# Patient Record
Sex: Male | Born: 1960 | Race: Black or African American | Hispanic: No | Marital: Single | Smoking: Current every day smoker
Health system: Southern US, Community
[De-identification: ages and names within clinical notes are randomized; demographics above are authoritative.]

## PROBLEM LIST (undated history)

## (undated) DIAGNOSIS — F319 Bipolar disorder, unspecified: Secondary | ICD-10-CM

## (undated) DIAGNOSIS — D869 Sarcoidosis, unspecified: Secondary | ICD-10-CM

## (undated) DIAGNOSIS — B1920 Unspecified viral hepatitis C without hepatic coma: Secondary | ICD-10-CM

## (undated) DIAGNOSIS — W3400XA Accidental discharge from unspecified firearms or gun, initial encounter: Secondary | ICD-10-CM

## (undated) DIAGNOSIS — I619 Nontraumatic intracerebral hemorrhage, unspecified: Secondary | ICD-10-CM

---

## 2014-07-14 ENCOUNTER — Inpatient Hospital Stay
Admit: 2014-07-14 | Discharge: 2014-07-14 | Disposition: A | Discharge status: Home / Self Care | Payer: PRIVATE HEALTH INSURANCE | Attending: Emergency Medicine

## 2014-07-14 DIAGNOSIS — T401X1A Poisoning by heroin, accidental (unintentional), initial encounter: Secondary | ICD-10-CM

## 2014-07-14 NOTE — ED Provider Notes (Addendum)
HPI Comments: Jimmy Curry is a 53 y.o. male who presents to the ED via EMS with complaint of heroin overdose. Patient was found unresponsive in his backyard after he overdosed on IV Heroin. His O2 sats was 93% on RA when he was found. He initially had no response to intranasal narcan 2 mg, but had significant improvement with narcan 0.8 mg IV. Patient began vomiting afterwards with vomiting continued in the ED upon arrival. Pt admits that he used IV heroin in celebration of the new year. Hx of IVDA, but states that he has not used in "years." At this time, patient denies having any acute pain. Denies chest pain or SOB.     Patient is a 53 y.o. male presenting with Ingested Medication. The history is provided by the patient and the EMS personnel.   Drug Overdose  This is a recurrent problem. The current episode started less than 1 hour ago. The problem occurs constantly. The problem has been rapidly improving. Pertinent negatives include no chest pain, no abdominal pain, no headaches and no shortness of breath. Nothing aggravates the symptoms. Nothing relieves the symptoms. Treatments tried: narcan. The treatment provided significant relief.        Past Medical History:   Diagnosis Date   ??? Sarcoidosis (HCC)    ; Hep C     History reviewed. No pertinent past surgical history.      History reviewed. No pertinent family history.    History     Social History   ??? Marital Status: SINGLE     Spouse Name: N/A     Number of Children: N/A   ??? Years of Education: N/A     Occupational History   ??? Not on file.     Social History Main Topics   ??? Smoking status: Not on file   ??? Smokeless tobacco: Not on file   ??? Alcohol Use: Yes   ??? Drug Use: Yes     Special: Heroin   ??? Sexual Activity: Not on file     Other Topics Concern   ??? Not on file     Social History Narrative   ??? No narrative on file       ALLERGIES: Review of patient's allergies indicates no known allergies.      Review of Systems    Constitutional: Negative.  Negative for fever, chills and fatigue.   HENT: Negative.  Negative for congestion, rhinorrhea and sore throat.    Eyes: Negative for pain and visual disturbance.   Respiratory: Negative.  Negative for cough, chest tightness and shortness of breath.    Cardiovascular: Negative.  Negative for chest pain, palpitations and leg swelling.   Gastrointestinal: Positive for vomiting. Negative for nausea, abdominal pain, diarrhea and constipation.   Endocrine: Negative.  Negative for polydipsia and polyuria.   Genitourinary: Negative.  Negative for dysuria, decreased urine volume and difficulty urinating.   Musculoskeletal: Negative.  Negative for myalgias, back pain, arthralgias, neck pain and neck stiffness.   Skin: Negative.  Negative for color change and rash.   Neurological: Negative.  Negative for weakness, light-headedness, numbness and headaches.   All other systems reviewed and are negative.      Filed Vitals:    07/14/14 1050 07/14/14 1100   BP: 120/91 120/87   Pulse: 116 104   Temp: 96.5 ??F (35.8 ??C)    Resp: 18 20   Height: 5\' 8"  (1.727 m)    Weight: 77.111 kg (170 lb)  SpO2: 100% 96%            Physical Exam   Constitutional: He is oriented to person, place, and time. He appears well-developed and well-nourished. No distress.   HENT:   Head: Normocephalic and atraumatic.   Mouth/Throat: Oropharynx is clear and moist.   Eyes: Conjunctivae and EOM are normal. Pupils are equal, round, and reactive to light. No scleral icterus.   Neck: Normal range of motion. Neck supple. No JVD present. No tracheal deviation present. No thyromegaly present.   Cardiovascular: Normal rate, regular rhythm, normal heart sounds and intact distal pulses.  Exam reveals no gallop and no friction rub.    No murmur heard.  Pulmonary/Chest: Effort normal and breath sounds normal. No respiratory distress. He has no wheezes. He has no rales. He exhibits no tenderness.    Abdominal: Soft. Bowel sounds are normal. He exhibits no distension and no mass. There is no tenderness. There is no rebound and no guarding.   Musculoskeletal: Normal range of motion. He exhibits no edema or tenderness.   Palpable 2+ bilateral pedal pulses dorsalis pedis, no calf tenderness   Neurological: He is alert and oriented to person, place, and time. No cranial nerve deficit.   Skin: Skin is warm and dry. He is not diaphoretic. No pallor.   Psychiatric: He has a normal mood and affect. His behavior is normal. Judgment and thought content normal.   Nursing note and vitals reviewed.       MDM  Number of Diagnoses or Management Options  Heroin overdose, accidental or unintentional, initial encounter:   Diagnosis management comments: 53 y.o. male with hx of IVDA (pt states has not used in years), brought in by EMS for IV Heroin Overdose. Initially no response to intranasal narcan 2 mg, but with significant response to narcan 0.8 mg IV. Patient alert and oriented x 3, and with stable vital signs in the ED. No signs of head injury. No focal neurologic deficits.       Plan:     SBIRT   Observe for 1 hour  -----  10:54 AM  Documented by Alwyn PeaJi Sun Yi, acting as a scribe for Dr. Rudy JewVictoria Romaniuk MD.     PROVIDER ATTESTATION:  12:29 PM    The entirety of this note, signed by me, accurately reflects all works, treatments, procedures, and medical decision making performed by me, Edythe LynnVictoria M Romaniuk, MD.              Patient Progress  Patient progress: stable      ED Course:     11:10 AM  Pt hyperventilating.   Upon reeval, patient with clear lung sounds.     11:26 AM  Pt no longer hyperventilating.     12:29 PM  Pt clinically stable, no complaints. Will discharge to home. Pt counseled to discontinue drug use.     Procedures

## 2014-07-14 NOTE — ED Notes (Signed)
Bedside and Verbal shift change report given to Ron, RN (oncoming nurse) by Caroline, RN (offgoing nurse). Report included the following information SBAR, ED Summary and MAR.

## 2014-07-14 NOTE — ED Notes (Signed)
This is a back timed note: Report given to RN Vernona RiegerLaura as the Primary RN will be off the unit. Pt condition remains stable. He continues to be fully A/O x 3, VS remain WNL. Oxygen turned off to check for hypoxia. No change noted in oxygen level. Pending dispo by the Attending.

## 2014-07-14 NOTE — ED Notes (Signed)
Pt brought in via EMS for heroin overdose. Pt found unresponsive, awoke after 0.8mg  narcan IV injection. Pt awake and alert upon arrival. Vomiting-given 4mg  Zofran PTA.

## 2014-07-14 NOTE — ED Notes (Signed)
Pt condition remains stable and unchanged. VS continue to be WNL. All needs met, no requests made. Pending arrival of family to take him home. Pt condition is improved.

## 2014-07-14 NOTE — ED Notes (Signed)
[facility], assumed care of pt at this time. Pt is sitting on the stretcher, tachapneac no pain reported. Pt encouraged to breath deep and slow, sats =99% on NC 2-Liters. Pt is stable, no acute changes noted from shift report. Call bell is in reach, bed board updated. Pt is stable. Given a large ice water per request. Dr. Eldridge Daceomaniuk is at the bedside.

## 2014-07-14 NOTE — ED Notes (Signed)
Discharge instructions given with 0 prescriptions. Pt to follow up with his Primary MD, or return if symptoms worsen. All questions were answered at this time, pt verbalized understanding. Pt condition is stable.

## 2014-07-14 NOTE — ED Notes (Signed)
Pt resting awake, alert, reports nausea relieved by zofran from EMS. On monitor resting no distress.

## 2016-12-16 ENCOUNTER — Emergency Department: Payer: Self-pay

## 2016-12-16 ENCOUNTER — Emergency Department
Admission: EM | Admit: 2016-12-16 | Discharge: 2016-12-16 | Discharge status: Against Medical Advice | Payer: Self-pay | Attending: Emergency Medicine | Admitting: Emergency Medicine

## 2016-12-16 ENCOUNTER — Encounter: Payer: Self-pay | Admitting: *Deleted

## 2016-12-16 DIAGNOSIS — Y929 Unspecified place or not applicable: Secondary | ICD-10-CM | POA: Insufficient documentation

## 2016-12-16 DIAGNOSIS — Y999 Unspecified external cause status: Secondary | ICD-10-CM | POA: Insufficient documentation

## 2016-12-16 DIAGNOSIS — L03113 Cellulitis of right upper limb: Secondary | ICD-10-CM

## 2016-12-16 DIAGNOSIS — W57XXXA Bitten or stung by nonvenomous insect and other nonvenomous arthropods, initial encounter: Secondary | ICD-10-CM | POA: Insufficient documentation

## 2016-12-16 DIAGNOSIS — L03114 Cellulitis of left upper limb: Secondary | ICD-10-CM | POA: Insufficient documentation

## 2016-12-16 DIAGNOSIS — F17200 Nicotine dependence, unspecified, uncomplicated: Secondary | ICD-10-CM | POA: Insufficient documentation

## 2016-12-16 DIAGNOSIS — Y939 Activity, unspecified: Secondary | ICD-10-CM | POA: Insufficient documentation

## 2016-12-16 HISTORY — DX: Sarcoidosis, unspecified: D86.9

## 2016-12-16 HISTORY — DX: Unspecified viral hepatitis C without hepatic coma: B19.20

## 2016-12-16 MED ORDER — SODIUM CHLORIDE 0.9 % IV BOLUS (SEPSIS): 1000.0000 mL | Freq: Once | INTRAVENOUS | Status: DC

## 2016-12-16 NOTE — ED Notes (Addendum)
See triage note  States he noticed a possible tick to left hand at thumb web space  States he tried to pull off an insect/tick from that area   Pt has open wound to area  With swelling and redness

## 2016-12-16 NOTE — ED Triage Notes (Signed)
Pt arrives with a "bite" to his left hand at the base of his thumb, states he noticed it Saturday, states he poured bleach on his hand to try and kill what he believes is a worm in his hand, states when you shine a light on it it begins to move and it hurts, red and white area noticed with a dark middle, states "it comes up to the surface because it is cold in there", states general hand pain, states he used tweezers to try and pick it out

## 2016-12-16 NOTE — ED Notes (Signed)
Patient refusing IV placement and wound culture at this time. PA at bedside speaking to patient. Patient states, "I'm fine I know what to do". Risks of leaving explained to patient by PA. Patient verbalizes understanding but still wishes to leave. Electronic AMA form signed and witnessed by this RN. Patient refusing discharge vital signs be taken. Even and non labored respirations noted. Patient ambulatory.

## 2016-12-16 NOTE — Discharge Instructions (Signed)
Patient has elected to leave AMA. Highly encourage patient to remain for medical treatment and explained risk of leaving AMA. Patient verbalized understanding of risk and wish to leave AMA.

## 2016-12-16 NOTE — ED Provider Notes (Signed)
[facility] Emergency Department Provider Note   ____________________________________________   I have reviewed the triage vital signs and the nursing notes.   HISTORY  Chief Complaint Insect Bite    HPI Alexis Castaneda is a 56 y.o. male    Patient denies fever, chills, headache, vision changes, chest pain, chest tightness, shortness of breath, abdominal pain, nausea and vomiting.  Past Medical History:  Diagnosis Date  . Hepatitis C   . Sarcoidosis     There are no active problems to display for this patient.   History reviewed. No pertinent surgical history.  Prior to Admission medications   Not on File    Allergies Patient has no known allergies.  History reviewed. No pertinent family history.  Social History Social History  Substance Use Topics  . Smoking status: Current Every Day Smoker  . Smokeless tobacco: Never Used  . Alcohol use Yes    Review of Systems Constitutional: Negative for fever/chills Eyes: No visual changes. ENT:  Negative for sore throat and for difficulty swallowing Cardiovascular: Denies chest pain. Respiratory: Denies cough Denies shortness of breath. Gastrointestinal: No abdominal pain.  No nausea, vomiting, diarrhea. Musculoskeletal: Negative for back pain. Negative for generalized body aches. Skin: Negative for rash. Positive for open wound the left webspace of the thumb. Neurological: Negative for headaches.  Negative focal weakness or numbness. Negative for loss of consciousness. Able to ambulate. ____________________________________________   PHYSICAL EXAM:  VITAL SIGNS: ED Triage Vitals [12/16/16 1121]  Enc Vitals Group     BP 108/75     Pulse Rate (!) 106     Resp 16     Temp 99.2 F (37.3 C)     Temp Source Oral     SpO2 98 %     Weight 150 lb (68 kg)     Height 5\' 8"  (1.727 m)     Head Circumference      Peak Flow      Pain Score      Pain Loc      Pain Edu?      Excl. in GC?      Constitutional: Alert and oriented. Well appearing and in no acute distress.  Head: Normocephalic and atraumatic. Eyes: Conjunctivae are normal.  Mouth/Throat: Mucous membranes are moist.  Neck: Supple. Cardiovascular: Normal rate, regular rhythm. Normal distal pulses. Respiratory: Normal respiratory effort.  Gastrointestinal: Soft and nontender. No distention. Musculoskeletal: Nontender with normal range of motion in all extremities. Neurologic: Normal speech and language. No gross focal neurologic deficits are appreciate. No sensory loss or abnormal reflexes.  Skin:  Skin is warm, dry and intact. No rash noted. Open wound left hand approximately thumb web space with purulent drainage, edematous and induration.  Psychiatric: Mood and affect are normal.  ____________________________________________   LABS (all labs ordered are listed, but only abnormal results are displayed)  Labs Reviewed - No data to display ____________________________________________  EKG None  ____________________________________________  RADIOLOGY DG left hand IMPRESSION: There is subtle soft tissue lucency in the thenar are eminence which may reflect gas as might be seen with cellulitis. No radiopaque foreign body is observed. ____________________________________________   PROCEDURES  Procedure(s) performed: no    Critical Care performed: no ____________________________________________   INITIAL IMPRESSION / ASSESSMENT AND PLAN / ED COURSE  Pertinent labs & imaging results that were available during my care of the patient were reviewed by me and considered in my medical decision making (see chart for details).  Patient presents  with open wound along left hand thumb web space. Physical exam findings and imaging are reassuring wound is likely cellulitis. Spoke with the patient and recommended drawing labs, culturing the wound base in order to appropriately treat the likely infection. Patient  refused further treatment and requested to sign out AMA. With the RN in the room, explained the risks of leaving without treatment and the patient verbalized understanding of the risk and requested to be discharged AMA. Patient signed out AMA.     _______________   FINAL CLINICAL IMPRESSION(S) / ED DIAGNOSES  Final diagnoses:  Bug bite with infection, initial encounter  Cellulitis of right hand       NEW MEDICATIONS STARTED DURING THIS VISIT:  There are no discharge medications for this patient.    Note:  This document was prepared using Dragon voice recognition software and may include unintentional dictation errors.    Clois Comber, PA-C 12/16/16 1508    Minna Antis, MD 12/17/16 2244

## 2017-09-18 ENCOUNTER — Emergency Department: Payer: Self-pay

## 2017-09-18 ENCOUNTER — Other Ambulatory Visit: Payer: Self-pay

## 2017-09-18 ENCOUNTER — Encounter: Payer: Self-pay | Admitting: Emergency Medicine

## 2017-09-18 ENCOUNTER — Emergency Department
Admission: EM | Admit: 2017-09-18 | Discharge: 2017-09-18 | Disposition: A | Discharge status: Home / Self Care | Payer: Self-pay | Attending: Emergency Medicine | Admitting: Emergency Medicine

## 2017-09-18 DIAGNOSIS — R4789 Other speech disturbances: Secondary | ICD-10-CM | POA: Insufficient documentation

## 2017-09-18 DIAGNOSIS — F17200 Nicotine dependence, unspecified, uncomplicated: Secondary | ICD-10-CM | POA: Insufficient documentation

## 2017-09-18 DIAGNOSIS — H532 Diplopia: Secondary | ICD-10-CM | POA: Insufficient documentation

## 2017-09-18 DIAGNOSIS — F309 Manic episode, unspecified: Secondary | ICD-10-CM | POA: Insufficient documentation

## 2017-09-18 DIAGNOSIS — F319 Bipolar disorder, unspecified: Secondary | ICD-10-CM | POA: Insufficient documentation

## 2017-09-18 DIAGNOSIS — F308 Other manic episodes: Secondary | ICD-10-CM

## 2017-09-18 DIAGNOSIS — F1410 Cocaine abuse, uncomplicated: Secondary | ICD-10-CM

## 2017-09-18 HISTORY — DX: Nontraumatic intracerebral hemorrhage, unspecified: I61.9

## 2017-09-18 HISTORY — DX: Bipolar disorder, unspecified: F31.9

## 2017-09-18 LAB — URINALYSIS, COMPLETE (UACMP) WITH MICROSCOPIC
Bilirubin Urine: NEGATIVE
Glucose, UA: NEGATIVE mg/dL
Hgb urine dipstick: NEGATIVE
Ketones, ur: 5 mg/dL — AB
Leukocytes, UA: NEGATIVE
Nitrite: NEGATIVE
PROTEIN: 30 mg/dL — AB
Specific Gravity, Urine: 1.028 (ref 1.005–1.030)
pH: 5 (ref 5.0–8.0)

## 2017-09-18 LAB — CBC WITH DIFFERENTIAL/PLATELET
BASOS ABS: 0 10*3/uL (ref 0–0.1)
BASOS PCT: 1 %
EOS PCT: 2 %
Eosinophils Absolute: 0.1 10*3/uL (ref 0–0.7)
HCT: 47.6 % (ref 40.0–52.0)
Hemoglobin: 15.8 g/dL (ref 13.0–18.0)
LYMPHS PCT: 18 %
Lymphs Abs: 0.9 10*3/uL — ABNORMAL LOW (ref 1.0–3.6)
MCH: 28 pg (ref 26.0–34.0)
MCHC: 33.1 g/dL (ref 32.0–36.0)
MCV: 84.7 fL (ref 80.0–100.0)
Monocytes Absolute: 0.7 10*3/uL (ref 0.2–1.0)
Monocytes Relative: 13 %
NEUTROS ABS: 3.4 10*3/uL (ref 1.4–6.5)
Neutrophils Relative %: 66 %
Platelets: 315 10*3/uL (ref 150–440)
RBC: 5.62 MIL/uL (ref 4.40–5.90)
RDW: 13.8 % (ref 11.5–14.5)
WBC: 5.1 10*3/uL (ref 3.8–10.6)

## 2017-09-18 LAB — URINE DRUG SCREEN, QUALITATIVE (ARMC ONLY)
Amphetamines, Ur Screen: NOT DETECTED
BARBITURATES, UR SCREEN: NOT DETECTED
Benzodiazepine, Ur Scrn: NOT DETECTED
Cannabinoid 50 Ng, Ur ~~LOC~~: NOT DETECTED
Cocaine Metabolite,Ur ~~LOC~~: POSITIVE — AB
MDMA (Ecstasy)Ur Screen: NOT DETECTED
METHADONE SCREEN, URINE: NOT DETECTED
Opiate, Ur Screen: NOT DETECTED
Phencyclidine (PCP) Ur S: NOT DETECTED
TRICYCLIC, UR SCREEN: NOT DETECTED

## 2017-09-18 LAB — COMPREHENSIVE METABOLIC PANEL
ALBUMIN: 4.7 g/dL (ref 3.5–5.0)
ALT: 32 U/L (ref 17–63)
AST: 41 U/L (ref 15–41)
Alkaline Phosphatase: 87 U/L (ref 38–126)
Anion gap: 12 (ref 5–15)
BUN: 9 mg/dL (ref 6–20)
CO2: 21 mmol/L — AB (ref 22–32)
CREATININE: 1.12 mg/dL (ref 0.61–1.24)
Calcium: 9.3 mg/dL (ref 8.9–10.3)
Chloride: 104 mmol/L (ref 101–111)
GFR calc Af Amer: >60 mL/min (ref >60)
GLUCOSE: 109 mg/dL — AB (ref 65–99)
Potassium: 3.7 mmol/L (ref 3.5–5.1)
Sodium: 137 mmol/L (ref 135–145)
Total Bilirubin: 1 mg/dL (ref 0.3–1.2)
Total Protein: 8.3 g/dL — ABNORMAL HIGH (ref 6.5–8.1)

## 2017-09-18 LAB — ETHANOL: Alcohol, Ethyl (B): <10 mg/dL (ref <10)

## 2017-09-18 LAB — TROPONIN I

## 2017-09-18 MED ORDER — QUETIAPINE FUMARATE 300 MG PO TABS: 300.0000 mg | ORAL_TABLET | Freq: Every day | ORAL | 1 refills | Status: DC

## 2017-09-18 MED ORDER — DIAZEPAM 5 MG PO TABS
10.0000 mg | ORAL_TABLET | Freq: Once | ORAL | Status: AC
  Administered 2017-09-18: 10 mg via ORAL
  Filled 2017-09-18: qty 2

## 2017-09-18 NOTE — ED Provider Notes (Signed)
[facility] Emergency Department Provider Note       Time seen: ----------------------------------------- 12:48 PM on 09/18/2017 -----------------------------------------   I have reviewed the triage vital signs and the nursing notes.  HISTORY   Chief Complaint No chief complaint on file.    HPI Alexis Castaneda is a 57 y.o. male with a history of otitis C and sarcoidosis who presents to the ED for altered mental status.  Patient arrives via EMS for altered mental status.  Patient presents with pressured speech and no further information is available at this time.  He denies any complaints other than some double vision that he had had earlier.  Patient denies alcohol or drug abuse.  Past Medical History:  Diagnosis Date  . Hepatitis C   . Sarcoidosis     There are no active problems to display for this patient.   No past surgical history on file.  Allergies Patient has no known allergies.  Social History Social History   Tobacco Use  . Smoking status: Current Every Day Smoker  . Smokeless tobacco: Never Used  Substance Use Topics  . Alcohol use: Yes  . Drug use: Not on file   Review of Systems Constitutional: Negative for fever. Eyes: Negative for vision changes ENT:  Negative for congestion, sore throat Cardiovascular: Negative for chest pain. Respiratory: Negative for shortness of breath. Gastrointestinal: Negative for abdominal pain, vomiting and diarrhea. Genitourinary: Negative for dysuria. Musculoskeletal: Negative for back pain. Skin: Negative for rash. Neurological: Negative for headaches, focal weakness or numbness.  All systems negative/normal/unremarkable except as stated in the HPI  ____________________________________________   PHYSICAL EXAM:  VITAL SIGNS: ED Triage Vitals  Enc Vitals Group     BP      Pulse      Resp      Temp      Temp src      SpO2      Weight      Height      Head Circumference      Peak  Flow      Pain Score      Pain Loc      Pain Edu?      Excl. in GC?    Constitutional: Alert and oriented. Well appearing and in no distress. Eyes: Conjunctivae are normal. Normal extraocular movements. ENT   Head: Normocephalic and atraumatic.   Nose: No congestion/rhinnorhea.   Mouth/Throat: Mucous membranes are moist.   Neck: No stridor. Cardiovascular: Normal rate, regular rhythm. No murmurs, rubs, or gallops. Respiratory: Normal respiratory effort without tachypnea nor retractions. Breath sounds are clear and equal bilaterally. No wheezes/rales/rhonchi. Gastrointestinal: Soft and nontender. Normal bowel sounds Musculoskeletal: Nontender with normal range of motion in extremities. No lower extremity tenderness nor edema. Neurologic:  Normal speech and language. No gross focal neurologic deficits are appreciated.  Skin:  Skin is warm, dry and intact. No rash noted. Psychiatric: Elevated mood, pressured speech ____________________________________________  ED COURSE:  As part of my medical decision making, I reviewed the following data within the electronic MEDICAL RECORD NUMBER History obtained from family if available, nursing notes, old chart and ekg, as well as notes from prior ED visits. Patient presented for altered mental status and possible manic behavior, we will assess with labs as indicated at this time.   Procedures ____________________________________________   LABS (pertinent positives/negatives)  Labs Reviewed  CBC WITH DIFFERENTIAL/PLATELET - Abnormal; Notable for the following components:      Result Value  Lymphs Abs 0.9 (*)    All other components within normal limits  COMPREHENSIVE METABOLIC PANEL - Abnormal; Notable for the following components:   CO2 21 (*)    Glucose, Bld 109 (*)    Total Protein 8.3 (*)    All other components within normal limits  URINALYSIS, COMPLETE (UACMP) WITH MICROSCOPIC - Abnormal; Notable for the following components:    Color, Urine AMBER (*)    APPearance HAZY (*)    Ketones, ur 5 (*)    Protein, ur 30 (*)    Bacteria, UA RARE (*)    Squamous Epithelial / LPF 0-5 (*)    All other components within normal limits  URINE DRUG SCREEN, QUALITATIVE ([facility] ONLY) - Abnormal; Notable for the following components:   Cocaine Metabolite,Ur  POSITIVE (*)    All other components within normal limits  TROPONIN I  ETHANOL  CBG MONITORING, ED    RADIOLOGY  CT head IMPRESSION: Negative CT of the head. ____________________________________________  DIFFERENTIAL DIAGNOSIS   Acute psychosis, acute mania, substance abuse, overdose, occult infection, dehydration  FINAL ASSESSMENT AND PLAN  Acute mania, cocaine abuse   Plan: The patient had presented for altered mental status with elevated mood and pressured speech. Patient's labs are unremarkable except for cocaine and his tox screen. Patient's imaging is normal.  He is stable for psychiatric evaluation and disposition.   Ulice DashJohnathan E Klever [place], MD   Note: This note was generated in part or whole with voice recognition software. Voice recognition is usually quite accurate but there are transcription errors that can and very often do occur. I apologize for any typographical errors that were not detected and corrected.     Emily FilbertWilliams, Kayd Launer E, MD 09/18/17 [phone]

## 2017-09-18 NOTE — ED Notes (Signed)
Calvin, TTS at bedside at this time.  

## 2017-09-18 NOTE — ED Notes (Signed)
Dr. Clapacs at bedside at this time.  

## 2017-09-18 NOTE — Consult Note (Signed)
[place] Psychiatry Consult   Reason for Consult: Consult for 57 year old man who came to the emergency room initially seeking help for pain in his side later noticed to be hypomanic Referring Physician: Jimmye Norman Patient Identification: Alexis Castaneda MRN:  [id number] Principal Diagnosis: Hypomania [facility]) Diagnosis:   Patient Active Problem List   Diagnosis Date Noted  . 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 Bipolar 1 disorder ([place]) [F31.9] 09/18/2017  . Cocaine abuse ([place]) [F14.10] 09/18/2017    Total Time spent with patient: 1 hour  Subjective:   Alexis Castaneda is a 57 y.o. male patient admitted with "maybe I do need some help".  HPI: Patient interviewed chart reviewed.  This is a 57 year old man who came to the emergency room today initially complaining of pain in his flank and blurry vision last night while driving.  Emergency room doctor noticed that he had abnormal mental status and required him to stay to see the psychiatrist.  Patient tells a rambling story about how he was in [place] last night and used a lot of cocaine with a friend.  He then decided to drive all the way back to [place].  On the way back he kept having problems with his depth perception.  When he came home this morning his mother told him to come to the hospital.  Patient says that other than last night he has been sleeping okay.  Feels like his mood is really good.  Admits however that his mood gets labile at times and he gets hyperactive.  Patient claims that yesterday was an unusual situation to use cocaine and that he had not had any for months before that.  Denies that he had been drinking or using any other drugs.  Patient denies any suicidal or homicidal thoughts.  Although he was a little belligerent when he found out he was committed he did not express any violence.  Patient states in agreement to getting back on psychiatric medicine.  Social history: A lot of his stories are rambling and a little  confusing but apparently he claims he lives in [place] full-time taking care of his mother and he had just been back up to [facility] for a few days to settle some of his legal affairs.  Medical history: History of sarcoidosis and hepatitis C not seeing a doctor or on any medicine currently.  Substance abuse history: Admits that he has had problems with cocaine in the past but says he had been off it for weeks or months recently until last night.  Denies that he uses other drugs.  Past Psychiatric History: Patient admits he has had psychiatric treatment in the past mostly in the [place] area.  Unclear still to me whether he has been admitted to the hospital.  Admitted to one previous suicide attempt in 2006 but none since then.  Patient says that he has a diagnosis of bipolar disorder and was most recently on a combination of Depakote Seroquel and Wellbutrin but has not taken it in about 2 years.  Risk to Self: Suicidal Ideation: No Suicidal Intent: No Is patient at risk for suicide?: No Suicidal Plan?: No Access to Means: No What has been your use of drugs/alcohol within the last 12 months?: Cocaine & Alcohol How many times?: 0 Other Self Harm Risks: n/a Triggers for Past Attempts: None known Intentional Self Injurious Behavior: None Risk to Others: Homicidal Ideation: No Thoughts of Harm to Others: No Current Homicidal Intent: No Current Homicidal Plan: No Access to Homicidal Means: No  Identified Victim: Reports of none History of harm to others?: No Assessment of Violence: None Noted Violent Behavior Description: Reports of none Does patient have access to weapons?: No Criminal Charges Pending?: No Does patient have a court date: No Prior Inpatient Therapy: Prior Inpatient Therapy: Yes Prior Therapy Dates: Unable to share dates Prior Therapy Facilty/Provider(s): [facility] Reason for Treatment: "Dual Diagnosis"--Bipolar & Substance Use Prior Outpatient  Therapy: Prior Outpatient Therapy: Yes Prior Therapy Dates: 2017 Prior Therapy Facilty/Provider(s): [facility] Reason for Treatment: "Dual Diagnosis"--Bipolar & Substance Use Does patient have an ACCT team?: No Does patient have Intensive In-House Services?  : No Does patient have [facility] services? : No Does patient have P4CC services?: No  Past Medical History:  Past Medical History:  Diagnosis Date  . Bipolar 1 disorder ([place])   . Brain bleed ([place])   . Hepatitis C   . Sarcoidosis    History reviewed. No pertinent surgical history. Family History: History reviewed. No pertinent family history. Family Psychiatric  History: Denies any Social History:  Social History   Substance and Sexual Activity  Alcohol Use Yes  . Alcohol/week: 1.8 oz  . Types: 3 Cans of beer per week   Comment: daily     Social History   Substance and Sexual Activity  Drug Use Yes  . Types: Cocaine   Comment: past 2016    Social History   Socioeconomic History  . Marital status: Single    Spouse name: None  . Number of children: None  . Years of education: None  . Highest education level: None  Social Needs  . Financial resource strain: None  . Food insecurity - worry: None  . Food insecurity - inability: None  . Transportation needs - medical: None  . Transportation needs - non-medical: None  Occupational History  . None  Tobacco Use  . Smoking status: Current Every Day Smoker    Packs/day: 0.50  . Smokeless tobacco: Never Used  Substance and Sexual Activity  . Alcohol use: Yes    Alcohol/week: 1.8 oz    Types: 3 Cans of beer per week    Comment: daily  . Drug use: Yes    Types: Cocaine    Comment: past 2016  . Sexual activity: None  Other Topics Concern  . None  Social History Narrative  . None   Additional Social History:    Allergies:  No Known Allergies  Labs:  Results for orders placed or performed during the hospital encounter of 09/18/17 (from the past 48  hour(s))  CBC with Differential     Status: Abnormal   Collection Time: 09/18/17  1:11 PM  Result Value Ref Range   WBC 5.1 3.8 - 10.6 K/uL   RBC 5.62 4.40 - 5.90 MIL/uL   Hemoglobin 15.8 13.0 - 18.0 g/dL   HCT 47.6 40.0 - 52.0 %   MCV 84.7 80.0 - 100.0 fL   MCH 28.0 26.0 - 34.0 pg   MCHC 33.1 32.0 - 36.0 g/dL   RDW 13.8 11.5 - 14.5 %   Platelets 315 150 - 440 K/uL   Neutrophils Relative % 66 %   Neutro Abs 3.4 1.4 - 6.5 K/uL   Lymphocytes Relative 18 %   Lymphs Abs 0.9 (L) 1.0 - 3.6 K/uL   Monocytes Relative 13 %   Monocytes Absolute 0.7 0.2 - 1.0 K/uL   Eosinophils Relative 2 %   Eosinophils Absolute 0.1 0 - 0.7 K/uL   Basophils Relative  1 %   Basophils Absolute 0.0 0 - 0.1 K/uL    Comment: Performed at [facility], [address] [place], [place] [address]  Comprehensive metabolic panel     Status: Abnormal   Collection Time: 09/18/17  1:11 PM  Result Value Ref Range   Sodium 137 135 - 145 mmol/L   Potassium 3.7 3.5 - 5.1 mmol/L   Chloride 104 101 - 111 mmol/L   CO2 21 (L) 22 - 32 mmol/L   Glucose, Bld 109 (H) 65 - 99 mg/dL   BUN 9 6 - 20 mg/dL   Creatinine, Ser 1.12 0.61 - 1.24 mg/dL   Calcium 9.3 8.9 - 10.3 mg/dL   Total Protein 8.3 (H) 6.5 - 8.1 g/dL   Albumin 4.7 3.5 - 5.0 g/dL   AST 41 15 - 41 U/L   ALT 32 17 - 63 U/L   Alkaline Phosphatase 87 38 - 126 U/L   Total Bilirubin 1.0 0.3 - 1.2 mg/dL   GFR calc non Af Amer >60 >60 mL/min   GFR calc Af Amer >60 >60 mL/min    Comment: (NOTE) The eGFR has been calculated using the CKD EPI equation. This calculation has not been validated in all clinical situations. eGFR's persistently <60 mL/min signify possible Chronic Kidney Disease.    Anion gap 12 5 - 15    Comment: Performed at [facility], [address] [place], [place] [address]  Urinalysis, Complete w Microscopic     Status: Abnormal   Collection Time: 09/18/17  1:11 PM  Result Value Ref Range   Color, Urine AMBER (A) YELLOW     Comment: BIOCHEMICALS MAY BE AFFECTED BY COLOR   APPearance HAZY (A) CLEAR   Specific Gravity, Urine 1.028 1.005 - 1.030   pH 5.0 5.0 - 8.0   Glucose, UA NEGATIVE NEGATIVE mg/dL   Hgb urine dipstick NEGATIVE NEGATIVE   Bilirubin Urine NEGATIVE NEGATIVE   Ketones, ur 5 (A) NEGATIVE mg/dL   Protein, ur 30 (A) NEGATIVE mg/dL   Nitrite NEGATIVE NEGATIVE   Leukocytes, UA NEGATIVE NEGATIVE   RBC / HPF 0-5 0 - 5 RBC/hpf   WBC, UA 0-5 0 - 5 WBC/hpf   Bacteria, UA RARE (A) NONE SEEN   Squamous Epithelial / LPF 0-5 (A) NONE SEEN   Mucus PRESENT     Comment: Performed at [facility], [address] [place], [place] [address]  Troponin I     Status: None   Collection Time: 09/18/17  1:11 PM  Result Value Ref Range   Troponin I <0.03 <0.03 ng/mL    Comment: Performed at [facility], [address] [place], [place] [address]  Ethanol     Status: None   Collection Time: 09/18/17  1:11 PM  Result Value Ref Range   Alcohol, Ethyl (B) <10 <10 mg/dL    Comment:        LOWEST DETECTABLE LIMIT FOR SERUM ALCOHOL IS 10 mg/dL FOR MEDICAL PURPOSES ONLY Performed at [facility], [address] [place], [place] [address]   Urine Drug Screen, Qualitative ([facility] only)     Status: Abnormal   Collection Time: 09/18/17  1:11 PM  Result Value Ref Range   Tricyclic, Ur Screen NONE DETECTED NONE DETECTED   Amphetamines, Ur Screen NONE DETECTED NONE DETECTED   MDMA (Ecstasy)Ur Screen NONE DETECTED NONE DETECTED   Cocaine Metabolite,Ur [place] POSITIVE (A) NONE DETECTED   Opiate, Ur Screen NONE DETECTED NONE DETECTED  Phencyclidine (PCP) Ur S NONE DETECTED NONE DETECTED   Cannabinoid 50 Ng, Ur [place] NONE DETECTED NONE DETECTED   Barbiturates, Ur Screen NONE DETECTED NONE DETECTED   Benzodiazepine, Ur Scrn NONE DETECTED NONE DETECTED   Methadone Scn, Ur NONE DETECTED NONE DETECTED    Comment: (NOTE) Tricyclics + metabolites, urine    Cutoff 1000 ng/mL Amphetamines + metabolites,  urine  Cutoff 1000 ng/mL MDMA (Ecstasy), urine              Cutoff 500 ng/mL Cocaine Metabolite, urine          Cutoff 300 ng/mL Opiate + metabolites, urine        Cutoff 300 ng/mL Phencyclidine (PCP), urine         Cutoff 25 ng/mL Cannabinoid, urine                 Cutoff 50 ng/mL Barbiturates + metabolites, urine  Cutoff 200 ng/mL Benzodiazepine, urine              Cutoff 200 ng/mL Methadone, urine                   Cutoff 300 ng/mL The urine drug screen provides only a preliminary, unconfirmed analytical test result and should not be used for non-medical purposes. Clinical consideration and professional judgment should be applied to any positive drug screen result due to possible interfering substances. A more specific alternate chemical method must be used in order to obtain a confirmed analytical result. Gas chromatography / mass spectrometry (GC/MS) is the preferred confirmat ory method. Performed at [facility], [address] [place], [place] [address]     No current facility-administered medications for this encounter.    Current Outpatient Medications  Medication Sig Dispense Refill  . QUEtiapine (SEROQUEL) 300 MG tablet Take 1 tablet (300 mg total) by mouth at bedtime. 30 tablet 1    Musculoskeletal: Strength & Muscle Tone: within normal limits Gait & Station: normal Patient leans: N/A  Psychiatric Specialty Exam: Physical Exam  Nursing note and vitals reviewed. Constitutional: He appears well-developed and well-nourished.  HENT:  Head: Normocephalic and atraumatic.  Eyes: Conjunctivae are normal. Pupils are equal, round, and reactive to light.  Neck: Normal range of motion.  Cardiovascular: Regular rhythm and normal heart sounds.  Respiratory: Effort normal. No respiratory distress.  GI: Soft.  Musculoskeletal: Normal range of motion.  Neurological: He is alert.  Skin: Skin is warm and dry.  Psychiatric: He has a normal mood and affect. His  speech is rapid and/or pressured. He is agitated and hyperactive. He is not aggressive and not combative. Thought content is not paranoid. Cognition and memory are impaired. He expresses impulsivity. He expresses no homicidal and no suicidal ideation.    Review of Systems  Constitutional: Negative.   HENT: Negative.   Eyes: Negative.   Respiratory: Negative.   Cardiovascular: Negative.   Gastrointestinal: Negative.   Musculoskeletal: Positive for myalgias.  Skin: Negative.   Neurological: Negative.   Psychiatric/Behavioral: Positive for memory loss and substance abuse. Negative for depression, hallucinations and suicidal ideas. The patient is nervous/anxious and has insomnia.     Blood pressure (!) 144/100, pulse (!) 107, temperature 97.7 F (36.5 C), temperature source Axillary, resp. rate 18, SpO2 94 %.There is no height or weight on file to calculate BMI.  General Appearance: Fairly Groomed  Eye Contact:  Good  Speech:  Pressured  Volume:  Increased  Mood:  Anxious, Euphoric and Irritable  Affect:  Full Range  Thought Process:  Disorganized  Orientation:  Full (Time, Place, and Person)  Thought Content:  Tangential  Suicidal Thoughts:  No  Homicidal Thoughts:  No  Memory:  Immediate;   Fair Recent;   Fair Remote;   Fair  Judgement:  Impaired  Insight:  Fair  Psychomotor Activity:  Restlessness  Concentration:  Concentration: Fair  Recall:  [place] of Knowledge:  Good  Language:  Fair  Akathisia:  No  Handed:  Right  AIMS (if indicated):     Assets:  Desire for Improvement Housing Resilience Social Support  ADL's:  Intact  Cognition:  Impaired,  Mild  Sleep:        Treatment Plan Summary: Daily contact with patient to assess and evaluate symptoms and progress in treatment, Medication management and Plan 57 year old man who appears to be hypomanic which also is probably compounded by his cocaine use last night.  Although his stories and thoughts are rambling he  is always able to get back to the point and he appears to be able to basically take care of himself.  There is no sign of any suicidal or homicidal thinking.  No sign of acutely dangerous behavior.  Patient was counseled in the future to not drive all night when he has been using cocaine but to sleep it off and then come back in the morning.  He is agreeable to restarting some psychiatric medicine.  I proposed starting with the Seroquel 300 mg at night as being the one least prone to problem side effects and most appropriate for his acute condition.  He will be given referral information to [facility] and other providers in [place].  He agrees to the plan.  Discontinue IVC.  Disposition: No evidence of imminent risk to self or others at present.   Patient does not meet criteria for psychiatric inpatient admission. Supportive therapy provided about ongoing stressors. Discussed crisis plan, support from social network, calling 911, coming to the Emergency Department, and calling Suicide Hotline.  Alethia Berthold, MD 09/18/2017 5:04 PM

## 2017-09-18 NOTE — ED Provider Notes (Signed)
-----------------------------------------   5:05 PM on 09/18/2017 -----------------------------------------  Patient has been seen by psychiatry, they believe the patient is safe for discharge home from a psychiatric standpoint.  Patient's medical workup is been largely nonrevealing besides cocaine positive urine drug screen.  Psychiatry will discharge with a prescription for Seroquel as well as Rha/RTS follow-up.  Overall patient appears well currently.  I believe the patient to be safe for discharge home at this time.   Minna AntisPaduchowski, Latonya Knight, MD 09/18/17 [phone]

## 2017-09-18 NOTE — ED Triage Notes (Signed)
States [place] yesterday and states he began to feel agitated and blurry eyed. Pulled over and walked around. Finished driving back home. Still agitated, talking nonstop, believes someone gave him heroin.

## 2017-09-18 NOTE — BH Assessment (Signed)
Assessment Note  Duc Crocket is an 57 y.o. male who presents to the ER via his personal car. Patient states, he came to the ER because he wasn't "feeling right." Patient recently visited [place] to see his PCP and while there, he reconnected with his former girlfriend and used cocaine. He states he was sober prior to meeting with the ex-girlfriend. While driving back to [place] he "started feeling funny." He further explained, he felt like he was having "mini strokes." Then he realized "this is the same feeling I get before I get manic." Patient reports of having a "dual diagnosis" and haven't taking his medications (Seroquel, Wellbutrin & Depakote) since 2017.  During the interview, the patient was restless and hyper-talkative but he was able to be redirected. He was able to provide appropriate answers to the questions. He was also able to share his insight into his mental illness and it's symptoms.  Patient denies current involvement with the legal system. He also denies SI/HI and AV/H.  Diagnosis: Bipolar  Past Medical History:  Past Medical History:  Diagnosis Date  . Bipolar 1 disorder ([facility])   . Brain bleed ([facility])   . Hepatitis C   . Sarcoidosis     History reviewed. No pertinent surgical history.  Family History: History reviewed. No pertinent family history.  Social History:  reports that he has been smoking.  He has been smoking about 0.50 packs per day. he has never used smokeless tobacco. He reports that he drinks about 1.8 oz of alcohol per week. He reports that he uses drugs. Drug: Cocaine.  Additional Social History:  Alcohol / Drug Use Pain Medications: See PTA Prescriptions: See PTA Over the Counter: See PTA History of alcohol / drug use?: Yes Longest period of sobriety (when/how long): Unable to quantify Negative Consequences of Use: (n/a) Substance #1 Name of Substance 1: Cocaine Substance #2 Name of Substance 2: Alcohol  CIWA: CIWA-Ar BP: (!)  144/100 Pulse Rate: (!) 107 COWS:    Allergies: No Known Allergies  Home Medications:  (Not in a hospital admission)  OB/GYN Status:  No LMP for male patient.  General Assessment Data Location of Assessment: [facility] ED TTS Assessment: In system Is this a Tele or Face-to-Face Assessment?: Face-to-Face Is this an Initial Assessment or a Re-assessment for this encounter?: Initial Assessment Marital status: Single Maiden name: n/a Is patient pregnant?: No Pregnancy Status: No Living Arrangements: Parent(caregiver for his mother) Can pt return to current living arrangement?: Yes Admission Status: Voluntary Is patient capable of signing voluntary admission?: Yes Referral Source: Self/Family/Friend Insurance type: Medicaid  Medical Screening Exam [facility] Walk-in ONLY) Medical Exam completed: Yes  Crisis Care Plan Living Arrangements: Parent(caregiver for his mother) Legal Guardian: Other:(Self) Name of Psychiatrist: Reports of none Name of Therapist: Reports of none  Education Status Is patient currently in school?: No Is the patient employed, unemployed or receiving disability?: Unemployed  Risk to self with the past 6 months Suicidal Ideation: No Has patient been a risk to self within the past 6 months prior to admission? : No Suicidal Intent: No Has patient had any suicidal intent within the past 6 months prior to admission? : No Is patient at risk for suicide?: No Suicidal Plan?: No Has patient had any suicidal plan within the past 6 months prior to admission? : No Access to Means: No What has been your use of drugs/alcohol within the last 12 months?: Cocaine & Alcohol Previous Attempts/Gestures: No How many times?: 0 Other Self Harm  Risks: n/a Triggers for Past Attempts: None known Intentional Self Injurious Behavior: None Family Suicide History: No Recent stressful life event(s): Other (Comment)(No longer taking medciations) Persecutory voices/beliefs?:  No Depression: Yes Substance abuse history and/or treatment for substance abuse?: Yes Suicide prevention information given to non-admitted patients: Not applicable  Risk to Others within the past 6 months Homicidal Ideation: No Does patient have any lifetime risk of violence toward others beyond the six months prior to admission? : No Thoughts of Harm to Others: No Current Homicidal Intent: No Current Homicidal Plan: No Access to Homicidal Means: No Identified Victim: Reports of none History of harm to others?: No Assessment of Violence: None Noted Violent Behavior Description: Reports of none Does patient have access to weapons?: No Criminal Charges Pending?: No Does patient have a court date: No Is patient on probation?: No  Psychosis Hallucinations: None noted Delusions: None noted  Mental Status Report Appearance/Hygiene: Unremarkable, In scrubs Eye Contact: Good Motor Activity: Freedom of movement, Unremarkable Speech: Logical/coherent, Unremarkable Level of Consciousness: Alert Mood: Anxious, Pleasant Affect: Appropriate to circumstance, Depressed Anxiety Level: Minimal Thought Processes: Coherent, Relevant Judgement: Unimpaired Orientation: Person, Place, Time, Situation, Appropriate for developmental age Obsessive Compulsive Thoughts/Behaviors: None  Cognitive Functioning Concentration: Normal Memory: Recent Intact, Remote Intact Is patient IDD: No Is patient DD?: No Insight: Fair Impulse Control: Fair Appetite: Fair Have you had any weight changes? : No Change Sleep: No Change Total Hours of Sleep: 8 Vegetative Symptoms: None  ADLScreening [facility] Assessment Services) Patient's cognitive ability adequate to safely complete daily activities?: Yes Patient able to express need for assistance with ADLs?: Yes Independently performs ADLs?: Yes (appropriate for developmental age)  Prior Inpatient Therapy Prior Inpatient Therapy: Yes Prior Therapy Dates:  Unable to share dates Prior Therapy Facilty/Provider(s): [facility] Reason for Treatment: "Dual Diagnosis"--Bipolar & Substance Use  Prior Outpatient Therapy Prior Outpatient Therapy: Yes Prior Therapy Dates: 2017 Prior Therapy Facilty/Provider(s): [facility] Reason for Treatment: "Dual Diagnosis"--Bipolar & Substance Use Does patient have an ACCT team?: No Does patient have Intensive In-House Services?  : No Does patient have [facility] services? : No Does patient have P4CC services?: No  ADL Screening (condition at time of admission) Patient's cognitive ability adequate to safely complete daily activities?: Yes Is the patient deaf or have difficulty hearing?: No Does the patient have difficulty seeing, even when wearing glasses/contacts?: No Does the patient have difficulty concentrating, remembering, or making decisions?: No Patient able to express need for assistance with ADLs?: Yes Does the patient have difficulty dressing or bathing?: No Independently performs ADLs?: Yes (appropriate for developmental age) Does the patient have difficulty walking or climbing stairs?: No Weakness of Legs: None  Home Assistive Devices/Equipment Home Assistive Devices/Equipment: None  Therapy Consults (therapy consults require a physician order) PT Evaluation Needed: No OT Evalulation Needed: No SLP Evaluation Needed: No Abuse/Neglect Assessment (Assessment to be complete while patient is alone) Abuse/Neglect Assessment Can Be Completed: Yes Physical Abuse: Denies Verbal Abuse: Denies Sexual Abuse: Denies Exploitation of patient/patient's resources: Denies Self-Neglect: Denies Values / Beliefs Cultural Requests During Hospitalization: None Spiritual Requests During Hospitalization: None Consults Spiritual Care Consult Needed: No Social Work Consult Needed: No Merchant navy officer (For Healthcare) Does Patient Have a Medical Advance Directive?: No Would patient  like information on creating a medical advance directive?: No - Patient declined    Additional Information 1:1 In Past 12 Months?: No CIRT Risk: No Elopement Risk: No Does patient have medical clearance?: Yes  Child/Adolescent Assessment Running Away  Risk: Denies(Patient is an adult)  Disposition:  Disposition Initial Assessment Completed for this Encounter: Yes Disposition of Patient: (To be seen by Psych MD)  On Site Evaluation by:   Reviewed with Physician:    Lilyan Gilfordalvin J. Avary Eichenberger MS, LCAS, LPC, NCC, CCSI Therapeutic Triage Specialist 09/18/2017 4:18 PM

## 2017-09-18 NOTE — ED Notes (Signed)
Dr Toni Amendlapacs rescinded IVC papers/MD Paduchowski, RN Aundra MilletMegan, and ODS officer made aware.

## 2017-09-18 NOTE — ED Notes (Addendum)
Pt changed over to psych eval and ivc'd. Pt calm at this time but is not changed out.

## 2017-09-18 NOTE — Discharge Instructions (Signed)
You have been seen in the emergency department for a  psychiatric concern. You have been evaluated both medically as well as psychiatrically. Please follow-up with your outpatient resources provided. Return to the emergency department for any worsening symptoms, or any thoughts of hurting yourself or anyone else so that we may attempt to help you. 

## 2017-09-18 NOTE — ED Notes (Signed)
Pt given dinner tray at this time.  

## 2017-09-18 NOTE — ED Notes (Signed)
NAD noted at time of D/C. Pt denies questions or concerns. Pt ambulatory to the lobby at this time.  
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
## 2017-09-18 NOTE — BH Assessment (Addendum)
Per request of Psych MD (Dr. Toni Amendlapacs), writer provided the pt. with information and instructions on how to access Outpatient Mental Health & Substance Abuse Treatment ([facility]). Also gave patient information for [facility], so he can start the process of getting his Medicaid "switched" from [place] to [facility].  Patient denies SI/HI and AV/H.  __________ [facility] [address],  [place], [place] [address] [phone]  [facility] [address],  [place], [place] [address] [phone]

## 2017-10-13 ENCOUNTER — Other Ambulatory Visit: Payer: Self-pay | Admitting: Psychiatry

## 2017-11-04 ENCOUNTER — Emergency Department (HOSPITAL_COMMUNITY)
Admission: EM | Admit: 2017-11-04 | Discharge: 2017-11-04 | Disposition: A | Discharge status: Home / Self Care | Payer: Self-pay | Attending: Emergency Medicine | Admitting: Emergency Medicine

## 2017-11-04 ENCOUNTER — Encounter (HOSPITAL_COMMUNITY): Payer: Self-pay | Admitting: Emergency Medicine

## 2017-11-04 ENCOUNTER — Other Ambulatory Visit: Payer: Self-pay

## 2017-11-04 ENCOUNTER — Emergency Department (HOSPITAL_COMMUNITY): Payer: Self-pay

## 2017-11-04 DIAGNOSIS — Y9389 Activity, other specified: Secondary | ICD-10-CM | POA: Insufficient documentation

## 2017-11-04 DIAGNOSIS — Z79899 Other long term (current) drug therapy: Secondary | ICD-10-CM | POA: Insufficient documentation

## 2017-11-04 DIAGNOSIS — Y999 Unspecified external cause status: Secondary | ICD-10-CM | POA: Insufficient documentation

## 2017-11-04 DIAGNOSIS — M542 Cervicalgia: Secondary | ICD-10-CM

## 2017-11-04 DIAGNOSIS — Y92410 Unspecified street and highway as the place of occurrence of the external cause: Secondary | ICD-10-CM | POA: Insufficient documentation

## 2017-11-04 DIAGNOSIS — M5432 Sciatica, left side: Secondary | ICD-10-CM

## 2017-11-04 DIAGNOSIS — F17210 Nicotine dependence, cigarettes, uncomplicated: Secondary | ICD-10-CM | POA: Insufficient documentation

## 2017-11-04 MED ORDER — NAPROXEN 250 MG PO TABS
500.0000 mg | ORAL_TABLET | Freq: Once | ORAL | Status: AC
  Administered 2017-11-04: 500 mg via ORAL
  Filled 2017-11-04: qty 2

## 2017-11-04 MED ORDER — CYCLOBENZAPRINE HCL 10 MG PO TABS
5.0000 mg | ORAL_TABLET | Freq: Once | ORAL | Status: AC
  Administered 2017-11-04: 5 mg via ORAL
  Filled 2017-11-04: qty 1

## 2017-11-04 MED ORDER — CYCLOBENZAPRINE HCL 5 MG PO TABS: 5.0000 mg | ORAL_TABLET | Freq: Two times a day (BID) | ORAL | 0 refills | Status: DC | PRN

## 2017-11-04 MED ORDER — NAPROXEN 500 MG PO TABS: 500.0000 mg | ORAL_TABLET | Freq: Two times a day (BID) | ORAL | 0 refills | Status: DC
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
## 2017-12-23 ENCOUNTER — Encounter: Payer: Self-pay | Admitting: Emergency Medicine

## 2017-12-23 ENCOUNTER — Emergency Department
Admission: EM | Admit: 2017-12-23 | Discharge: 2017-12-23 | Disposition: A | Discharge status: Home / Self Care | Payer: Self-pay | Attending: Emergency Medicine | Admitting: Emergency Medicine

## 2017-12-23 ENCOUNTER — Other Ambulatory Visit: Payer: Self-pay

## 2017-12-23 DIAGNOSIS — R42 Dizziness and giddiness: Secondary | ICD-10-CM | POA: Insufficient documentation

## 2017-12-23 DIAGNOSIS — F17210 Nicotine dependence, cigarettes, uncomplicated: Secondary | ICD-10-CM | POA: Insufficient documentation

## 2017-12-23 DIAGNOSIS — F319 Bipolar disorder, unspecified: Secondary | ICD-10-CM | POA: Insufficient documentation

## 2017-12-23 DIAGNOSIS — F1410 Cocaine abuse, uncomplicated: Secondary | ICD-10-CM | POA: Insufficient documentation

## 2017-12-23 DIAGNOSIS — Z79899 Other long term (current) drug therapy: Secondary | ICD-10-CM | POA: Insufficient documentation

## 2017-12-23 LAB — URINALYSIS, COMPLETE (UACMP) WITH MICROSCOPIC
Bacteria, UA: NONE SEEN
Bilirubin Urine: NEGATIVE
GLUCOSE, UA: NEGATIVE mg/dL
HGB URINE DIPSTICK: NEGATIVE
Ketones, ur: NEGATIVE mg/dL
Leukocytes, UA: NEGATIVE
NITRITE: NEGATIVE
PROTEIN: NEGATIVE mg/dL
Specific Gravity, Urine: 1.026 (ref 1.005–1.030)
Squamous Epithelial / LPF: NONE SEEN (ref 0–5)
pH: 5 (ref 5.0–8.0)

## 2017-12-23 LAB — CBC
HCT: 44.6 % (ref 40.0–52.0)
HEMOGLOBIN: 14.9 g/dL (ref 13.0–18.0)
MCH: 28.6 pg (ref 26.0–34.0)
MCHC: 33.3 g/dL (ref 32.0–36.0)
MCV: 85.9 fL (ref 80.0–100.0)
PLATELETS: 233 10*3/uL (ref 150–440)
RBC: 5.19 MIL/uL (ref 4.40–5.90)
RDW: 13.7 % (ref 11.5–14.5)
WBC: 3.7 10*3/uL — ABNORMAL LOW (ref 3.8–10.6)

## 2017-12-23 LAB — TROPONIN I: Troponin I: <0.03 ng/mL (ref <0.03)

## 2017-12-23 LAB — URINE DRUG SCREEN, QUALITATIVE (ARMC ONLY)
AMPHETAMINES, UR SCREEN: NOT DETECTED
BENZODIAZEPINE, UR SCRN: NOT DETECTED
Barbiturates, Ur Screen: NOT DETECTED
Cannabinoid 50 Ng, Ur ~~LOC~~: POSITIVE — AB
Cocaine Metabolite,Ur ~~LOC~~: POSITIVE — AB
MDMA (ECSTASY) UR SCREEN: NOT DETECTED
Methadone Scn, Ur: NOT DETECTED
Opiate, Ur Screen: NOT DETECTED
PHENCYCLIDINE (PCP) UR S: NOT DETECTED
Tricyclic, Ur Screen: POSITIVE — AB

## 2017-12-23 LAB — BASIC METABOLIC PANEL
ANION GAP: 9 (ref 5–15)
BUN: 19 mg/dL (ref 6–20)
CALCIUM: 9.5 mg/dL (ref 8.9–10.3)
CO2: 28 mmol/L (ref 22–32)
Chloride: 104 mmol/L (ref 101–111)
Creatinine, Ser: 1.14 mg/dL (ref 0.61–1.24)
GFR calc Af Amer: >60 mL/min (ref >60)
Glucose, Bld: 114 mg/dL — ABNORMAL HIGH (ref 65–99)
Potassium: 4.4 mmol/L (ref 3.5–5.1)
Sodium: 141 mmol/L (ref 135–145)

## 2017-12-23 NOTE — ED Triage Notes (Signed)
Pt reports has felt dizzy sine this am. Pt denies pain, sob, fevers, etc.Pt does reports he saw some black spots.

## 2017-12-23 NOTE — ED Provider Notes (Signed)
[facility] Emergency Department Provider Note  Time seen: 11:43 AM  I have reviewed the triage vital signs and the nursing notes.   HISTORY  Chief Complaint Dizziness    HPI Ina KickJimmy Castaneda is a 57 y.o. male with a past medical history of bipolar, hepatitis, sarcoidosis, presents to the emergency department for dizziness.  According to the patient on Saturday he was very upset and relapsed using crack cocaine.  He states he was feeling okay yesterday however today around 7:00 this morning he says he got very dizzy lightheaded and was seeing black spots.  States he still went to work and try to work for a few hours but states he continued to feel dizzy and would occasionally see black spots.  He eventually told his boss that he needed to leave work to go to the hospital and get checked out.  Here the patient denies any chest pain or trouble breathing.  States he continues to feel dizzy which he describes as feeling "off balance."  Denies any spinning sensation.  Denies any fever.  Largely negative review of systems.  Has a secondary complaint the patient states he has been off of his psychiatric medications for approximately 1 year and is hoping to be referred to a psychiatrist to get back on his medications.   Past Medical History:  Diagnosis Date  . Bipolar 1 disorder (HCC)   . Brain bleed (HCC)   . Hepatitis C   . Sarcoidosis     Patient Active Problem List   Diagnosis Date Noted  . Hypomania (HCC) 09/18/2017  . Bipolar 1 disorder (HCC) 09/18/2017  . Cocaine abuse (HCC) 09/18/2017    History reviewed. No pertinent surgical history.  Prior to Admission medications   Medication Sig Start Date End Date Taking? Authorizing Provider  cyclobenzaprine (FLEXERIL) 5 MG tablet Take 1 tablet (5 mg total) by mouth 2 (two) times daily as needed for muscle spasms. 11/04/17   Horton, Mayer Maskerourtney F, MD  naproxen (NAPROSYN) 500 MG tablet Take 1 tablet (500 mg total) by mouth 2  (two) times daily. 11/04/17   Horton, Mayer Maskerourtney F, MD  QUEtiapine (SEROQUEL) 300 MG tablet Take 1 tablet (300 mg total) by mouth at bedtime. 09/18/17   Clapacs, Jackquline DenmarkJohn T, MD    No Known Allergies  No family history on file.  Social History Social History   Tobacco Use  . Smoking status: Current Every Day Smoker    Packs/day: 0.50  . Smokeless tobacco: Never Used  Substance Use Topics  . Alcohol use: Yes    Alcohol/week: 1.8 oz    Types: 3 Cans of beer per week    Comment: daily  . Drug use: Yes    Types: Cocaine    Comment: past 2016    Review of Systems Constitutional: Negative for fever.  Positive for dizziness Eyes: Negative for visual complaints ENT: Negative for recent illness/congestion Cardiovascular: Negative for chest pain. Respiratory: Negative for shortness of breath. Gastrointestinal: Negative for abdominal pain, vomiting Genitourinary: Negative for urinary compaints Musculoskeletal: Negative for leg pain or swelling Skin: Negative for skin complaints  Neurological: Negative for headache All other ROS negative  ____________________________________________   PHYSICAL EXAM:  VITAL SIGNS: ED Triage Vitals  Enc Vitals Group     BP 12/23/17 1101 102/68     Pulse Rate 12/23/17 1101 63     Resp 12/23/17 1101 16     Temp 12/23/17 1101 98.6 F (37 C)     Temp Source  12/23/17 1101 Oral     SpO2 12/23/17 1101 99 %     Weight 12/23/17 1032 170 lb (77.1 kg)     Height 12/23/17 1032 5\' 8"  (1.727 m)     Head Circumference --      Peak Flow --      Pain Score 12/23/17 1032 0     Pain Loc --      Pain Edu? --      Excl. in GC? --    Constitutional: Alert and oriented. Well appearing and in no distress. Eyes: Normal exam ENT   Head: Normocephalic and atraumatic   Mouth/Throat: Mucous membranes are moist. Cardiovascular: Normal rate, regular rhythm. No murmur Respiratory: Normal respiratory effort without tachypnea nor retractions. Breath sounds are  clear Gastrointestinal: Soft and nontender. No distention.   Musculoskeletal: Nontender with normal range of motion in all extremities. Neurologic:  Normal speech and language. No gross focal neurologic deficits  Skin:  Skin is warm, dry and intact.  Psychiatric: Mood and affect are normal.   ____________________________________________    EKG  EKG reviewed and interpreted by myself shows normal sinus rhythm at 66 bpm, narrow QRS, normal axis, normal intervals, no ST changes.  ____________________________________________   INITIAL IMPRESSION / ASSESSMENT AND PLAN / ED COURSE  Pertinent labs & imaging results that were available during my care of the patient were reviewed by me and considered in my medical decision making (see chart for details).  Patient presents to the emergency department for complaints of dizziness.  Differential would include electrolyte or metabolic abnormality, ACS, substance use, dehydration.  We will check labs and continue to closely monitor.  Overall the patient appears well with a reassuring physical examination.  Work is largely reassuring besides urine toxicology.  I discussed with the patient fluids, follow-up with [facility] and [facility].  Patient agreeable to plan of care.  ____________________________________________   FINAL CLINICAL IMPRESSION(S) / ED DIAGNOSES  Dizziness    Minna Antis, MD 12/23/17 1441

## 2019-10-18 IMAGING — CT CT HEAD W/O CM
3 series · 15 of 45 positions shown, 18 images · non-contrast
Comparison: None.

CLINICAL DATA: 56 y/o  M; level of consciousness.

EXAM:
CT HEAD WITHOUT CONTRAST
TECHNIQUE: Contiguous axial images were obtained from the base of the skull
through the vertex without intravenous contrast.

[Series 2: head wo · axial · 0.40mm/px · z∈[+143,+258]mm · 9 of 28 slices shown, 12 images]
[im 3/28  brain]
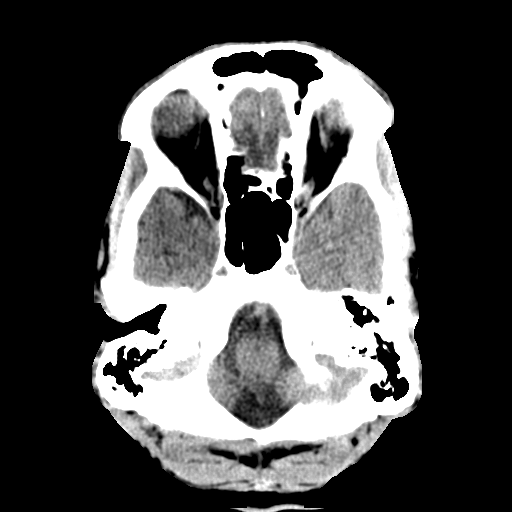
[im 3/28  bone]
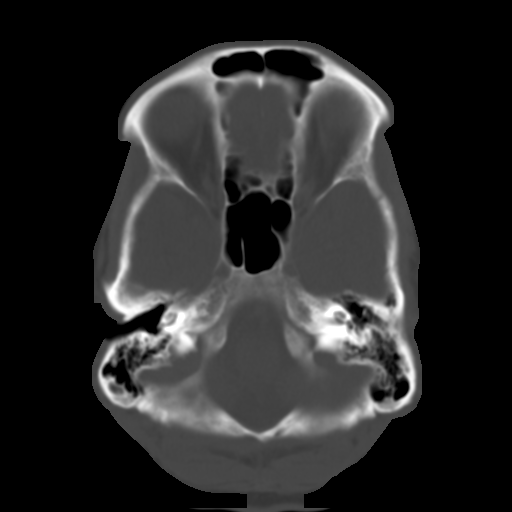
[im 6/28  brain]
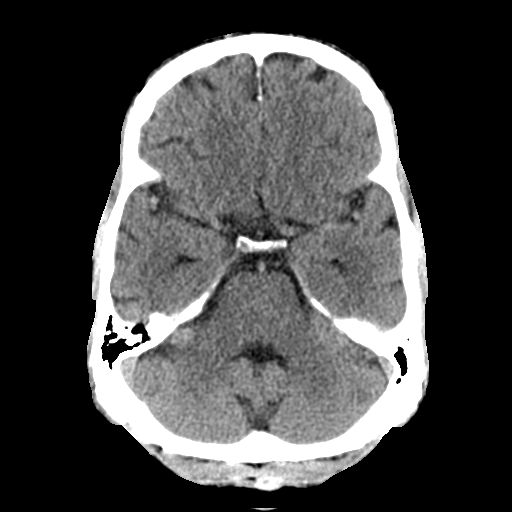
[im 9/28  brain]
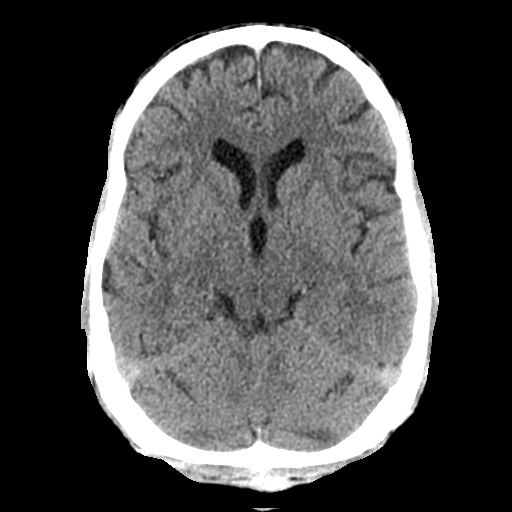
[im 12/28  brain]
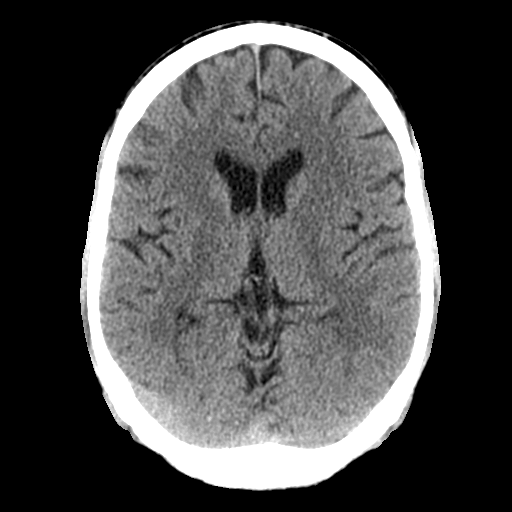
[im 15/28  brain]
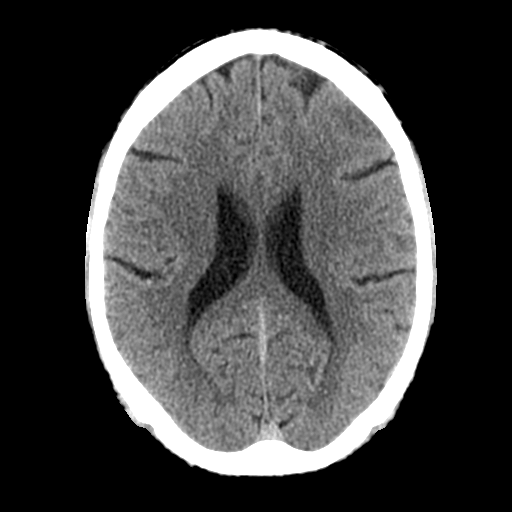
[im 15/28  bone]
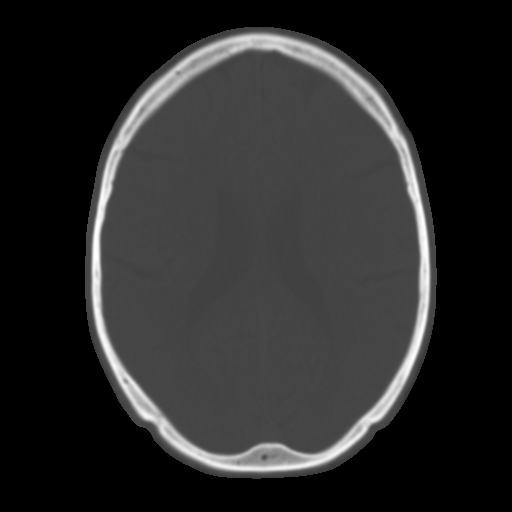
[im 17/28  brain]
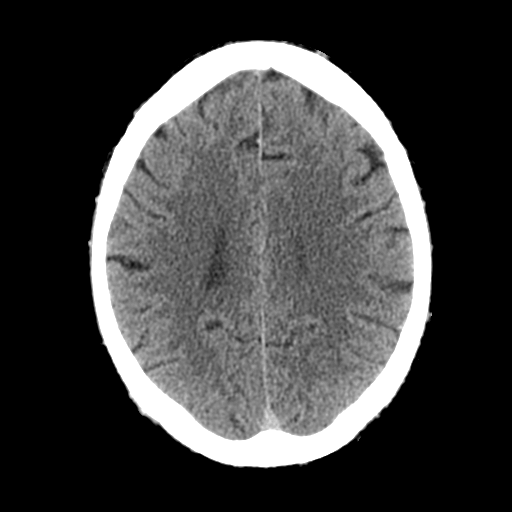
[im 20/28  brain]
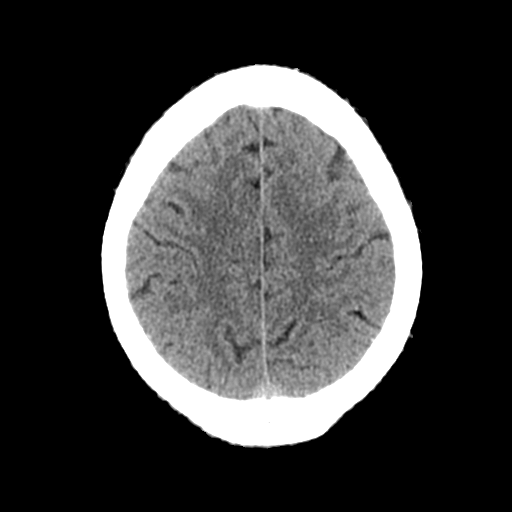
[im 23/28  brain]
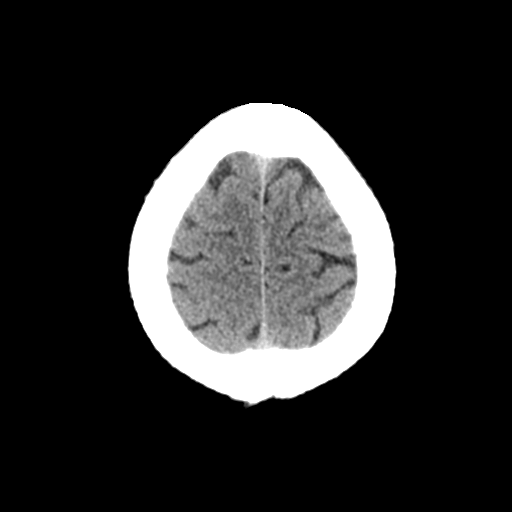
[im 26/28  brain]
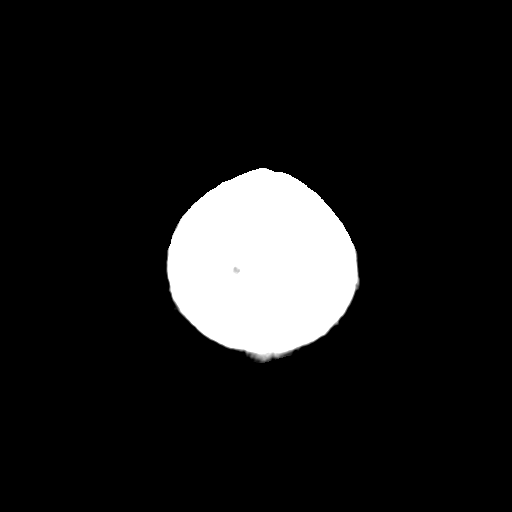
[im 26/28  bone]
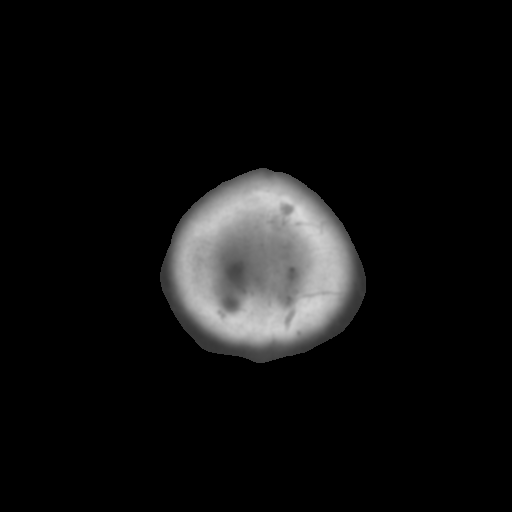

[Series 5: sagittal soft tissue · sagittal · 0.28mm/px · 3 of 53 slices shown]
[im 18/53  brain]
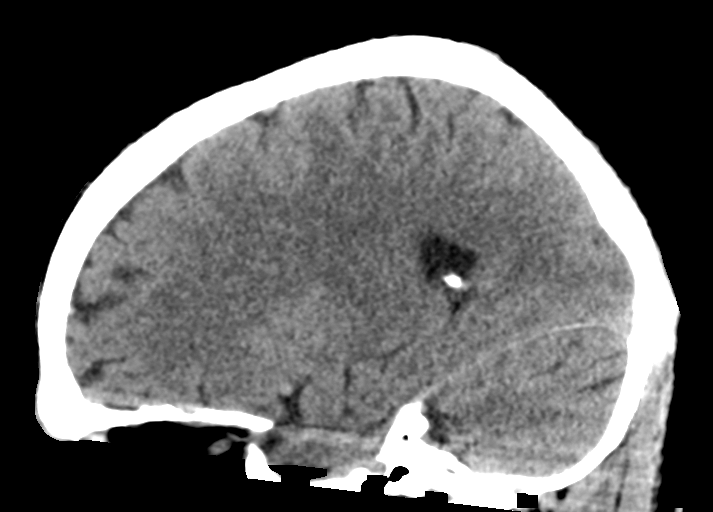
[im 27/53  brain]
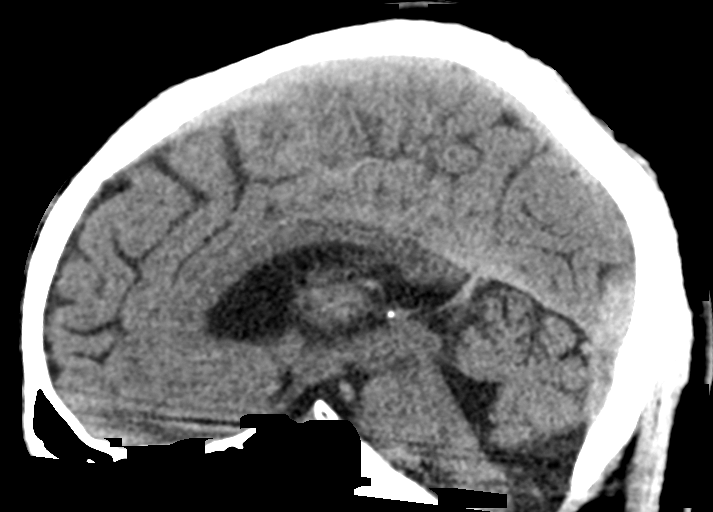
[im 35/53  brain]
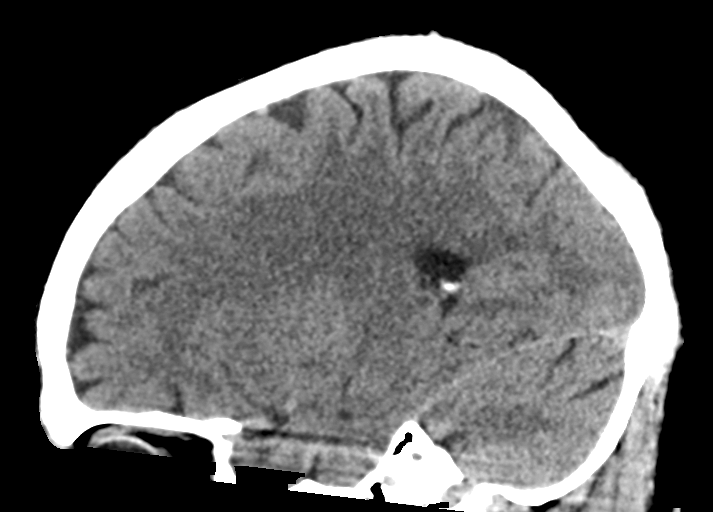

[Series 6: coronal soft tissue 2 · coronal · 0.28mm/px · 3 of 64 slices shown]
[im 22/64  brain]
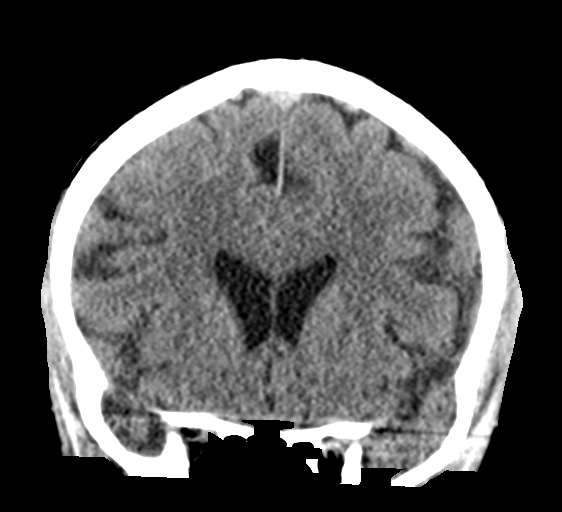
[im 29/64  brain]
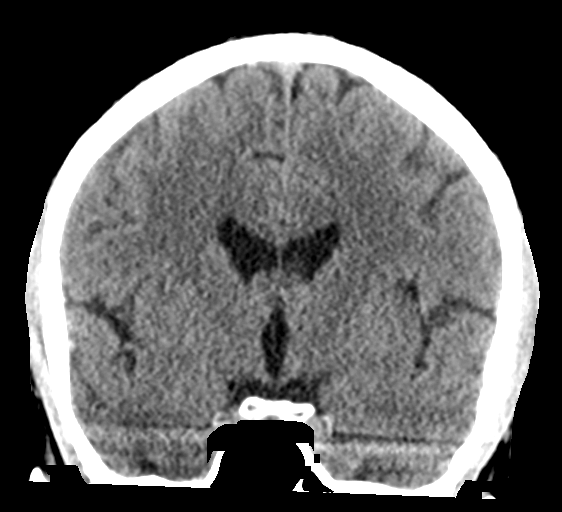
[im 36/64  brain]
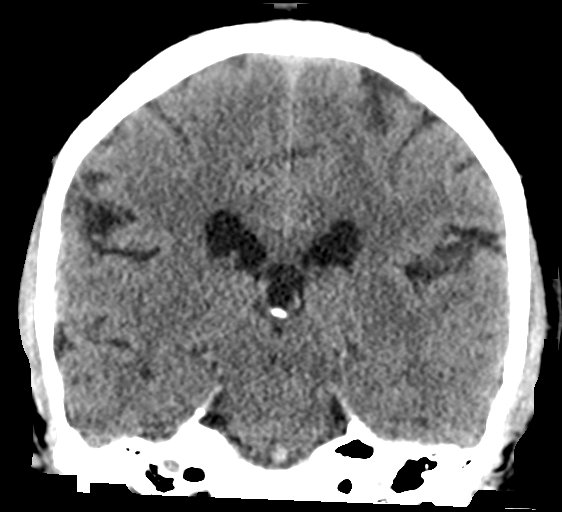

[15 of 45 positions shown; findings below may reference images not displayed]

FINDINGS: Brain: No evidence of acute infarction, hemorrhage, hydrocephalus,
extra-axial collection or mass lesion/mass effect.

Vascular: No hyperdense vessel or unexpected calcification.

Skull: Normal. Negative for fracture or focal lesion.

Sinuses/Orbits: No acute finding.

Other: None.
IMPRESSION: Negative CT of the head.

By: Havije Salentino M.D.

## 2021-03-22 ENCOUNTER — Emergency Department (HOSPITAL_COMMUNITY)
Admission: EM | Admit: 2021-03-22 | Discharge: 2021-03-22 | Disposition: A | Discharge status: Home / Self Care | Payer: Self-pay | Attending: Emergency Medicine | Admitting: Emergency Medicine

## 2021-03-22 ENCOUNTER — Other Ambulatory Visit: Payer: Self-pay

## 2021-03-22 ENCOUNTER — Encounter (HOSPITAL_COMMUNITY): Payer: Self-pay

## 2021-03-22 DIAGNOSIS — K921 Melena: Secondary | ICD-10-CM

## 2021-03-22 DIAGNOSIS — M5442 Lumbago with sciatica, left side: Secondary | ICD-10-CM | POA: Insufficient documentation

## 2021-03-22 DIAGNOSIS — F17210 Nicotine dependence, cigarettes, uncomplicated: Secondary | ICD-10-CM | POA: Insufficient documentation

## 2021-03-22 DIAGNOSIS — K644 Residual hemorrhoidal skin tags: Secondary | ICD-10-CM | POA: Insufficient documentation

## 2021-03-22 HISTORY — DX: Accidental discharge from unspecified firearms or gun, initial encounter: W34.00XA

## 2021-03-22 LAB — URINALYSIS, ROUTINE W REFLEX MICROSCOPIC
Bilirubin Urine: NEGATIVE
Glucose, UA: NEGATIVE mg/dL
Hgb urine dipstick: NEGATIVE
Ketones, ur: NEGATIVE mg/dL
Leukocytes,Ua: NEGATIVE
Nitrite: NEGATIVE
Protein, ur: NEGATIVE mg/dL
Specific Gravity, Urine: >1.03 — ABNORMAL HIGH (ref 1.005–1.030)
pH: 6 (ref 5.0–8.0)

## 2021-03-22 LAB — COMPREHENSIVE METABOLIC PANEL
ALT: 29 U/L (ref 0–44)
AST: 25 U/L (ref 15–41)
Albumin: 4.1 g/dL (ref 3.5–5.0)
Alkaline Phosphatase: 89 U/L (ref 38–126)
Anion gap: 7 (ref 5–15)
BUN: 14 mg/dL (ref 6–20)
CO2: 29 mmol/L (ref 22–32)
Calcium: 9.1 mg/dL (ref 8.9–10.3)
Chloride: 103 mmol/L (ref 98–111)
Creatinine, Ser: 1.07 mg/dL (ref 0.61–1.24)
GFR, Estimated: >60 mL/min (ref >60)
Glucose, Bld: 98 mg/dL (ref 70–99)
Potassium: 3.7 mmol/L (ref 3.5–5.1)
Sodium: 139 mmol/L (ref 135–145)
Total Bilirubin: 0.4 mg/dL (ref 0.3–1.2)
Total Protein: 7.1 g/dL (ref 6.5–8.1)

## 2021-03-22 LAB — CBC
HCT: 50.7 % (ref 39.0–52.0)
Hemoglobin: 16.7 g/dL (ref 13.0–17.0)
MCH: 29 pg (ref 26.0–34.0)
MCHC: 32.9 g/dL (ref 30.0–36.0)
MCV: 88 fL (ref 80.0–100.0)
Platelets: 277 10*3/uL (ref 150–400)
RBC: 5.76 MIL/uL (ref 4.22–5.81)
RDW: 12.8 % (ref 11.5–15.5)
WBC: 4 10*3/uL (ref 4.0–10.5)
nRBC: 0 % (ref 0.0–0.2)

## 2021-03-22 LAB — POC OCCULT BLOOD, ED: Fecal Occult Bld: NEGATIVE

## 2021-03-22 LAB — TYPE AND SCREEN
ABO/RH(D): O POS
Antibody Screen: NEGATIVE

## 2021-03-22 MED ORDER — CYCLOBENZAPRINE HCL 10 MG PO TABS
10.0000 mg | ORAL_TABLET | Freq: Once | ORAL | Status: AC
  Administered 2021-03-22: 10 mg via ORAL
  Filled 2021-03-22: qty 1

## 2021-03-22 MED ORDER — CYCLOBENZAPRINE HCL 10 MG PO TABS: 10.0000 mg | ORAL_TABLET | Freq: Two times a day (BID) | ORAL | 0 refills | Status: DC | PRN
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
## 2021-03-22 NOTE — Discharge Instructions (Addendum)
Lab work and imaging were reassuring.  I recommends stretch out your back, applying warm compresses to the area this is to help relieve some of your back pain.  Also recommend taking ibuprofen and or Tylenol every 6 hours as needed please follow dosing the back of bottle.I have given you a short course of narcotics please take as prescribed.   I have also given you a prescription for a muscle relaxer this can make you drowsy do not consume alcohol or operate heavy machinery when taking this medication.   Please follow your PCP as needed.  Come back to the emergency department if you develop chest pain, shortness of breath, severe abdominal pain, uncontrolled nausea, vomiting, diarrhea.

## 2021-03-22 NOTE — ED Triage Notes (Signed)
Pt presents to ED with complaints of black stools since yesterday am. Pt has second complaint of left leg pain states feels like something shoots down his leg and feels like needles sticking him in foot.

## 2021-04-03 ENCOUNTER — Encounter (HOSPITAL_COMMUNITY): Payer: Self-pay | Admitting: *Deleted

## 2021-04-03 ENCOUNTER — Emergency Department (HOSPITAL_COMMUNITY)
Admission: EM | Admit: 2021-04-03 | Discharge: 2021-04-03 | Disposition: A | Discharge status: Home / Self Care | Payer: Medicaid Other | Attending: Emergency Medicine | Admitting: Emergency Medicine

## 2021-04-03 ENCOUNTER — Other Ambulatory Visit: Payer: Self-pay

## 2021-04-03 DIAGNOSIS — F17210 Nicotine dependence, cigarettes, uncomplicated: Secondary | ICD-10-CM | POA: Insufficient documentation

## 2021-04-03 DIAGNOSIS — K859 Acute pancreatitis without necrosis or infection, unspecified: Secondary | ICD-10-CM | POA: Insufficient documentation

## 2021-04-03 LAB — COMPREHENSIVE METABOLIC PANEL WITH GFR
ALT: 26 U/L (ref 0–44)
AST: 26 U/L (ref 15–41)
Albumin: 4 g/dL (ref 3.5–5.0)
Alkaline Phosphatase: 87 U/L (ref 38–126)
Anion gap: 9 (ref 5–15)
BUN: 11 mg/dL (ref 6–20)
CO2: 23 mmol/L (ref 22–32)
Calcium: 8.8 mg/dL — ABNORMAL LOW (ref 8.9–10.3)
Chloride: 104 mmol/L (ref 98–111)
Creatinine, Ser: 1.09 mg/dL (ref 0.61–1.24)
GFR, Estimated: >60 mL/min
Glucose, Bld: 102 mg/dL — ABNORMAL HIGH (ref 70–99)
Potassium: 3.8 mmol/L (ref 3.5–5.1)
Sodium: 136 mmol/L (ref 135–145)
Total Bilirubin: 0.4 mg/dL (ref 0.3–1.2)
Total Protein: 7.1 g/dL (ref 6.5–8.1)

## 2021-04-03 LAB — CBC
HCT: 46.2 % (ref 39.0–52.0)
Hemoglobin: 15.3 g/dL (ref 13.0–17.0)
MCH: 28.8 pg (ref 26.0–34.0)
MCHC: 33.1 g/dL (ref 30.0–36.0)
MCV: 87 fL (ref 80.0–100.0)
Platelets: 213 10*3/uL (ref 150–400)
RBC: 5.31 MIL/uL (ref 4.22–5.81)
RDW: 13.3 % (ref 11.5–15.5)
WBC: 4.9 10*3/uL (ref 4.0–10.5)
nRBC: 0 % (ref 0.0–0.2)

## 2021-04-03 LAB — LIPASE, BLOOD: Lipase: 120 U/L — ABNORMAL HIGH (ref 11–51)

## 2021-04-03 LAB — URINALYSIS, ROUTINE W REFLEX MICROSCOPIC
Bilirubin Urine: NEGATIVE
Glucose, UA: NEGATIVE mg/dL
Hgb urine dipstick: NEGATIVE
Ketones, ur: NEGATIVE mg/dL
Leukocytes,Ua: NEGATIVE
Nitrite: NEGATIVE
Protein, ur: NEGATIVE mg/dL
Specific Gravity, Urine: >1.03 — ABNORMAL HIGH (ref 1.005–1.030)
pH: 6 (ref 5.0–8.0)

## 2021-04-03 MED ORDER — ALUM & MAG HYDROXIDE-SIMETH 200-200-20 MG/5ML PO SUSP: 30.0000 mL | Freq: Once | ORAL | Status: DC

## 2021-04-03 MED ORDER — LIDOCAINE VISCOUS HCL 2 % MT SOLN: 15.0000 mL | Freq: Once | OROMUCOSAL | Status: DC

## 2021-04-03 MED ORDER — ACETAMINOPHEN 325 MG PO TABS: 650.0000 mg | ORAL_TABLET | Freq: Four times a day (QID) | ORAL | Status: DC | PRN

## 2021-04-03 NOTE — Discharge Instructions (Addendum)
Your pancreas is inflamed.  Avoid alcohol.  Tylenol for pain
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
## 2021-04-03 NOTE — ED Triage Notes (Signed)
Vomiting for the last hour, also c/o abdominal pain and chest pain

## 2021-04-03 NOTE — ED Notes (Signed)
Patient came to the nurses station and stated that he needed to leave because his ride was here.  Wanted his IV taken out.  Took the patients IV out and the patient walked out the door.

## 2022-02-07 ENCOUNTER — Emergency Department (HOSPITAL_COMMUNITY)
Admission: EM | Admit: 2022-02-07 | Discharge: 2022-02-10 | Disposition: A | Discharge status: Home / Self Care | Payer: Medicaid Other | Attending: Emergency Medicine | Admitting: Emergency Medicine

## 2022-02-07 DIAGNOSIS — F1414 Cocaine abuse with cocaine-induced mood disorder: Secondary | ICD-10-CM | POA: Insufficient documentation

## 2022-02-07 DIAGNOSIS — F1910 Other psychoactive substance abuse, uncomplicated: Secondary | ICD-10-CM

## 2022-02-07 DIAGNOSIS — Z20822 Contact with and (suspected) exposure to covid-19: Secondary | ICD-10-CM | POA: Insufficient documentation

## 2022-02-07 DIAGNOSIS — F1410 Cocaine abuse, uncomplicated: Secondary | ICD-10-CM | POA: Diagnosis present

## 2022-02-07 DIAGNOSIS — R45850 Homicidal ideations: Secondary | ICD-10-CM

## 2022-02-07 DIAGNOSIS — F32A Depression, unspecified: Secondary | ICD-10-CM

## 2022-02-07 DIAGNOSIS — F319 Bipolar disorder, unspecified: Secondary | ICD-10-CM | POA: Diagnosis present

## 2022-02-07 LAB — COMPREHENSIVE METABOLIC PANEL
ALT: 36 U/L (ref 0–44)
AST: 47 U/L — ABNORMAL HIGH (ref 15–41)
Albumin: 4.4 g/dL (ref 3.5–5.0)
Alkaline Phosphatase: 83 U/L (ref 38–126)
Anion gap: 10 (ref 5–15)
BUN: 23 mg/dL (ref 8–23)
CO2: 24 mmol/L (ref 22–32)
Calcium: 9.6 mg/dL (ref 8.9–10.3)
Chloride: 109 mmol/L (ref 98–111)
Creatinine, Ser: 1.44 mg/dL — ABNORMAL HIGH (ref 0.61–1.24)
GFR, Estimated: 55 mL/min — ABNORMAL LOW (ref >60)
Glucose, Bld: 93 mg/dL (ref 70–99)
Potassium: 3.7 mmol/L (ref 3.5–5.1)
Sodium: 143 mmol/L (ref 135–145)
Total Bilirubin: 1 mg/dL (ref 0.3–1.2)
Total Protein: 7.4 g/dL (ref 6.5–8.1)

## 2022-02-07 LAB — ETHANOL: Alcohol, Ethyl (B): 20 mg/dL — ABNORMAL HIGH (ref <10)

## 2022-02-07 LAB — RAPID URINE DRUG SCREEN, HOSP PERFORMED
Amphetamines: NOT DETECTED
Barbiturates: NOT DETECTED
Benzodiazepines: NOT DETECTED
Cocaine: POSITIVE — AB
Opiates: NOT DETECTED
Tetrahydrocannabinol: NOT DETECTED

## 2022-02-07 LAB — SALICYLATE LEVEL: Salicylate Lvl: <7 mg/dL — ABNORMAL LOW (ref 7.0–30.0)

## 2022-02-07 LAB — CBC WITH DIFFERENTIAL/PLATELET
Abs Immature Granulocytes: 0.01 10*3/uL (ref 0.00–0.07)
Basophils Absolute: 0.1 10*3/uL (ref 0.0–0.1)
Basophils Relative: 1 %
Eosinophils Absolute: 0.3 10*3/uL (ref 0.0–0.5)
Eosinophils Relative: 6 %
HCT: 44.8 % (ref 39.0–52.0)
Hemoglobin: 15.1 g/dL (ref 13.0–17.0)
Immature Granulocytes: 0 %
Lymphocytes Relative: 35 %
Lymphs Abs: 1.8 10*3/uL (ref 0.7–4.0)
MCH: 28.7 pg (ref 26.0–34.0)
MCHC: 33.7 g/dL (ref 30.0–36.0)
MCV: 85 fL (ref 80.0–100.0)
Monocytes Absolute: 0.6 10*3/uL (ref 0.1–1.0)
Monocytes Relative: 12 %
Neutro Abs: 2.5 10*3/uL (ref 1.7–7.7)
Neutrophils Relative %: 46 %
Platelets: 292 10*3/uL (ref 150–400)
RBC: 5.27 MIL/uL (ref 4.22–5.81)
RDW: 13.3 % (ref 11.5–15.5)
WBC: 5.3 10*3/uL (ref 4.0–10.5)
nRBC: 0 % (ref 0.0–0.2)

## 2022-02-07 LAB — RESP PANEL BY RT-PCR (FLU A&B, COVID) ARPGX2
Influenza A by PCR: NEGATIVE
Influenza B by PCR: NEGATIVE
SARS Coronavirus 2 by RT PCR: NEGATIVE

## 2022-02-07 LAB — ACETAMINOPHEN LEVEL: Acetaminophen (Tylenol), Serum: <10 ug/mL — ABNORMAL LOW (ref 10–30)

## 2022-02-07 MED ORDER — ALUM & MAG HYDROXIDE-SIMETH 200-200-20 MG/5ML PO SUSP: 30.0000 mL | Freq: Four times a day (QID) | ORAL | Status: DC | PRN

## 2022-02-07 MED ORDER — ONDANSETRON HCL 4 MG PO TABS: 4.0000 mg | ORAL_TABLET | Freq: Three times a day (TID) | ORAL | Status: DC | PRN

## 2022-02-07 MED ORDER — NICOTINE 21 MG/24HR TD PT24
21.0000 mg | MEDICATED_PATCH | Freq: Every day | TRANSDERMAL | Status: DC
  Filled 2022-02-07: qty 1

## 2022-02-07 MED ORDER — LORAZEPAM 1 MG PO TABS
1.0000 mg | ORAL_TABLET | Freq: Once | ORAL | Status: AC
  Administered 2022-02-07: 1 mg via ORAL
  Filled 2022-02-07: qty 1

## 2022-02-07 MED ORDER — ACETAMINOPHEN 325 MG PO TABS: 650.0000 mg | ORAL_TABLET | ORAL | Status: DC | PRN

## 2022-02-07 NOTE — BH Assessment (Signed)
Attempted tele-assessment. Pt appeared somnolent and did not respond to any questions. Assessment will be completed when Pt is able to participate.   Pamalee Leyden, [facility], [facility] Triage Specialist [phone]
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
## 2022-02-07 NOTE — ED Provider Triage Note (Signed)
Emergency Medicine Provider Triage Evaluation Note  Alexis Castaneda , a 61 y.o. male  was evaluated in triage.  Pt complains of SI and HI has been out of his medications for approximately 9 days.  Reports he was doing well while taking his meds however now feels very uneasy, he is also having some visual hallucinations by seeing bugs crawl out of his fingers along with ears.  He is without any medical complaint at this time.  Ongoing history of depression which began after he was incarcerated and released.  Review of Systems  Positive: SI,HI, hallucinations Negative: Headache, cp, sob  Physical Exam  There were no vitals taken for this visit. Gen:   Awake, no distress   Resp:  Normal effort  MSK:   Moves extremities without difficulty  Other:    Medical Decision Making  Medically screening exam initiated at 8:11 PM.  Appropriate orders placed.  Alexis Castaneda was informed that the remainder of the evaluation will be completed by another provider, this initial triage assessment does not replace that evaluation, and the importance of remaining in the ED until their evaluation is complete.     Claude Manges, PA-C 02/07/22 2016

## 2022-02-07 NOTE — ED Triage Notes (Signed)
Pt via [facility] for SI/HI. Pt endorses auditory hallucinations, advises he "doesn't want to act on them." Pt calm, cooperative in triage.  Advises he's bought/done $800 of crack since Monday.  122/74 HR 80

## 2022-02-08 ENCOUNTER — Encounter (HOSPITAL_COMMUNITY): Payer: Self-pay | Admitting: Emergency Medicine

## 2022-02-08 ENCOUNTER — Other Ambulatory Visit: Payer: Self-pay

## 2022-02-08 MED ORDER — QUETIAPINE FUMARATE ER 50 MG PO TB24
100.0000 mg | ORAL_TABLET | Freq: Every day | ORAL | Status: DC
  Administered 2022-02-08: 100 mg via ORAL
  Filled 2022-02-08 (×2): qty 2

## 2022-02-08 MED ORDER — GABAPENTIN 100 MG PO CAPS
200.0000 mg | ORAL_CAPSULE | Freq: Three times a day (TID) | ORAL | Status: DC
  Administered 2022-02-08 – 2022-02-09 (×3): 200 mg via ORAL
  Filled 2022-02-08 (×3): qty 2

## 2022-02-08 MED ORDER — DIPHENHYDRAMINE HCL 25 MG PO CAPS
50.0000 mg | ORAL_CAPSULE | Freq: Once | ORAL | Status: AC
  Administered 2022-02-08: 50 mg via ORAL
  Filled 2022-02-08: qty 2

## 2022-02-08 NOTE — ED Notes (Signed)
Received verbal report from Ginny M RN at this time 

## 2022-02-08 NOTE — ED Notes (Signed)
Called to room by pt. Pt c/o something biting him since yesterday and that he has several bumps on him. Did note 2-3 small circular areas that were red and raised. Made provider Dr. Anitra Lauth

## 2022-02-08 NOTE — Progress Notes (Signed)
Inpatient Behavioral Health Placement  Pt meets inpatient criteria per Arvilla Market, NP.  Referral was sent to the following facilities;   Destination Service Provider Address Phone Fax  [facility]  [address] [place] [address] [phone]  [facility] [address]  [address] [place] [address] [phone]  [facility]  [address] [place] [address] [phone]  [facility]  [address] [place], [place] [address] [phone]  [facility]  [address] [place], [place] [address] [phone]  [facility]  [address] [place] [address] [phone]  [facility]  [address] [place] [address] [phone]  [facility]  [address], [place] [address] [phone]  [facility]  [address] [place] [address] [phone]  [facility]  [address], [place] [address] [phone]  [facility]  [address], [place] [address] [phone]  [facility]  [address] [place] [address] [phone]  [facility]  [address] [place] [address] [phone]  [facility]  [address] [place], [place] [address] [phone]  [facility]  [address] [place] [address] [phone]    Situation ongoing,  CSW will follow up.   Maryjean Ka, MSW, [facility] 02/08/2022  @ 5:45 PM

## 2022-02-08 NOTE — ED Notes (Signed)
Waiting to talk to TTS, TTS is in front of pt.

## 2022-02-08 NOTE — BH Assessment (Addendum)
Comprehensive Clinical Assessment (CCA) Note  02/08/2022 Alexis Castaneda [id number] DISPOSITION: Arvilla Market NP recommends an inpatient admission to assist with stabilization.     Flowsheet Row ED from 02/07/2022 in [facility] EMERGENCY DEPARTMENT ED from 04/03/2021 in [place] EMERGENCY DEPARTMENT ED from 03/22/2021 in [facility] EMERGENCY DEPARTMENT  C-SSRS RISK CATEGORY High Risk No Risk No Risk      The patient demonstrates the following risk factors for suicide: Chronic risk factors for suicide include: substance use disorder. Acute risk factors for suicide include: unemployment. Protective factors for this patient include: coping skills. Considering these factors, the overall suicide risk at this point appears to be high. Patient is not appropriate for outpatient follow up.   Patient is a 61 year old male that presents this date voluntary to [facility] with S/I and H/I. Patient is observed to be very angry and agitated and renders limited history. When asked in reference to S/I patient states "yeah I know what my plan is" although will not render any further information. When asked about H/I patient reported he will "f#ck up anyone who is trying to get at him" again patient renders limited history in reference to a plan or intent. Patient states he has relocated from [place] in the last 9 months and was receiving mental health services there from [facility] who assisted with medication management for depression and anxiety. Patient states the only medication he can remember being prescribed is Seroquel. Patient is uncertain of dosage or any other medications he had been taking. Patient reports he moved from the [place] area in 2020 to [place] and has been back in [place] for the last 9 months residing with his mother. When asked in reference to why patient decided to move back to the area patient stated, "I have my reasons." Patient declines to participate in the latter  part of the assessment stating, "I'm not saying all this s#it again." Patient declines to discuss his current symptoms stating, "I am just not right." Patient reports he has been using over 800 dollars worth of cocaine in the last week stating he used "at least 100 dollars worth" prior to arrival. Patient states he "has some beer once in a while" although again will not elaborate on time periods or amounts used. Patient was positive for cocaine on arrival with a BAL of 20. Per chart review patient was seen in 2019 when he presented to [facility] with mania and SA use at that time also. Patient renders conflicting history reporting AH on arrival per nursing notes but denies any AVH at the time of assessment. Patient states he needs an inpatient admission to assist with SA issues and have his medications restarted for depression. Patient also reports he would like to follow up with OP resources on discharge to have counseling and ongoing medication management.    Patient will not answer orientation questions and is observed to be agitated stating, "why you ask me this dumb s#it." Patient is angry throughout the interview and declined to render any extensive history. Patient's mood is angry/agitated with affect congruent. Patient's memory appears to be intact with thoughts somewhat disorganized. Patient does not appear to be responding to internal stimuli.                Chief Complaint:  Chief Complaint  Patient presents with   Suicidal   Homicidal   Visit Diagnosis: MDD recurrent without psychotic features, severe, Cocaine use, Alcohol abuse    CCA Screening, Triage  and Referral (STR)  Patient Reported Information How did you hear about [place]? Self  What Is the Reason for Your Visit/Call Today? Pt reports ongoing S/I and H/I although is vague in reference to S/I stating "I can get it done" in reference to H/I pt states he will harm "anyone that f#cks with him"  How Long Has This Been Causing You Problems?  1 wk - 1 month  What Do You Feel Would Help You the Most Today? Treatment for Depression or other mood problem; Alcohol or Drug Use Treatment   Have You Recently Had Any Thoughts About Hurting Yourself? Yes  Are You Planning to Commit Suicide/Harm Yourself At This time? No   Have you Recently Had Thoughts About Hurting Someone [name]? Yes  Are You Planning to Harm Someone at This Time? No  Explanation: No data recorded  Have You Used Any Alcohol or Drugs in the Past 24 Hours? Yes  How Long Ago Did You Use Drugs or Alcohol? No data recorded What Did You Use and How Much? Pt states he used over 100 dollars worth of cocaine prior to arrival and drank "a couple beers"   Do You Currently Have a Therapist/Psychiatrist? No  Name of Therapist/Psychiatrist: No data recorded  Have You Been Recently Discharged From Any Office Practice or Programs? No  Explanation of Discharge From Practice/Program: No data recorded    CCA Screening Triage Referral Assessment Type of Contact: Tele-Assessment  Telemedicine Service Delivery: Telemedicine service delivery: This service was provided via telemedicine using a 2-way, interactive audio and video technology  Is this Initial or Reassessment? Initial Assessment  Date Telepsych consult ordered in [facility]:  02/08/22  Time Telepsych consult ordered in [facility]:  No data recorded Location of Assessment: [facility] ED  Provider Location: [facility] Assessment Services   Collateral Involvement: None at this time   Does Patient Have a Automotive engineer Guardian? No data recorded Name and Contact of Legal Guardian: No data recorded If Minor and Not Living with Parent(s), Who has Custody? NA  Is CPS involved or ever been involved? Never  Is APS involved or ever been involved? Never   Patient Determined To Be At Risk for Harm To Self or Others Based on Review of Patient Reported Information or Presenting Complaint? Yes, for Self-Harm  Method: No data  recorded Availability of Means: No data recorded Intent: No data recorded Notification Required: No data recorded Additional Information for Danger to Others Potential: No data recorded Additional Comments for Danger to Others Potential: No data recorded Are There Guns or Other Weapons in Your Home? No data recorded Types of Guns/Weapons: No data recorded Are These Weapons Safely Secured?                            No data recorded Who Could Verify You Are Able To Have These Secured: No data recorded Do You Have any Outstanding Charges, Pending Court Dates, Parole/Probation? No data recorded Contacted To Inform of Risk of Harm To Self or Others: Other: Comment (NA)    Does Patient Present under Involuntary Commitment? No  IVC Papers Initial File Date: No data recorded  [place] of Residence: [place]   Patient Currently Receiving the Following Services: Not Receiving Services   Determination of Need: Emergent (2 hours)   Options For Referral: Inpatient Hospitalization     CCA Biopsychosocial Patient Reported Schizophrenia/Schizoaffective Diagnosis in Past: No   Strengths: Pt states he is willing  to participate in treatment   Mental Health Symptoms Depression:   Change in energy/activity; Difficulty Concentrating; Hopelessness   Duration of Depressive symptoms:  Duration of Depressive Symptoms: Greater than two weeks   Mania:   None   Anxiety:    Difficulty concentrating; Irritability; Restlessness   Psychosis:   None   Duration of Psychotic symptoms:    Trauma:   None   Obsessions:   None   Compulsions:   None   Inattention:   None   Hyperactivity/Impulsivity:   None   Oppositional/Defiant Behaviors:   None   Emotional Irregularity:   Chronic feelings of emptiness   Other Mood/Personality Symptoms:   NA    Mental Status Exam Appearance and self-care  Stature:   Average   Weight:   Average weight   Clothing:   Disheveled    Grooming:   Neglected   Cosmetic use:   None   Posture/gait:   Normal   Motor activity:   Agitated   Sensorium  Attention:   Distractible   Concentration:   Anxiety interferes   Orientation:   -- (Pt declines to answer stating "I know all that" and "you don't")   Recall/memory:   Normal   Affect and Mood  Affect:   Anxious   Mood:   Angry; Anxious   Relating  Eye contact:   Normal   Facial expression:   Angry   Attitude toward examiner:   Argumentative   Thought and Language  Speech flow:  Normal   Thought content:   Appropriate to Mood and Circumstances   Preoccupation:   None   Hallucinations:   None   Organization:  No data recorded  [employer] of Knowledge:   Poor   Intelligence:   Needs investigation   Abstraction:   Normal   Judgement:   Poor   Reality Testing:   Realistic   Insight:   Fair   Decision Making:   Normal   Social Functioning  Social Maturity:   Impulsive   Social Judgement:   Heedless   Stress  Stressors:   Other (Comment) (ongoing SA use)   Coping Ability:   Overwhelmed   Skill Deficits:   Decision making   Supports:   Support needed     Religion: Religion/Spirituality Are You A Religious Person?: No How Might This Affect Treatment?: NA  Leisure/Recreation: Leisure / Recreation Do You Have Hobbies?: No  Exercise/Diet: Exercise/Diet Do You Exercise?: No Do You Follow a Special Diet?: No Do You Have Any Trouble Sleeping?: Yes Explanation of Sleeping Difficulties: pt states he only has been sleeping 2 hours a night for the last week due to ongoing cocaine use   CCA Employment/Education Employment/Work Situation: Employment / Work Situation Employment Situation: Unemployed Patient's Job has Been Impacted by Current Illness: No Has Patient ever Been in Equities trader?: No  Education: Education Is Patient Currently Attending School?: No Last Grade Completed:  10 Did You Product manager?: No Did You Have An Individualized Education Program (IIEP): No Did You Have Any Difficulty At School?: No Patient's Education Has Been Impacted by Current Illness: No   CCA Family/Childhood History Family and Relationship History: Family history Marital status: Single Does patient have children?: No  Childhood History:  Childhood History By whom was/is the patient raised?: Mother Did patient suffer any verbal/emotional/physical/sexual abuse as a child?: No Did patient suffer from severe childhood neglect?: No Has patient ever been sexually abused/assaulted/raped as an adolescent or adult?:  No Was the patient ever a victim of a crime or a disaster?: No Witnessed domestic violence?: No Has patient been affected by domestic violence as an adult?: No  Child/Adolescent Assessment:     CCA Substance Use Alcohol/Drug Use: Alcohol / Drug Use Pain Medications: See MAR Prescriptions: See MAR Over the Counter: See MAR History of alcohol / drug use?: Yes Longest period of sobriety (when/how long): Unable to quantify Negative Consequences of Use:  (n/a) Withdrawal Symptoms: Agitation, Tingling, Aggressive/Assaultive Substance #1 Name of Substance 1: Cocaine pt UDS is positive for substance this date pt just reports "using alot" and declines to render any extensive hx 1 - Age of First Use: UTA 1 - Amount (size/oz): Pt states 800 dollars worth in the last week 1 - Frequency: Daily for the last week 1 - Duration: Ongoing 1 - Last Use / Amount: Pt states he used 100 worth of cocaine prior to arrival 1 - Method of Aquiring: UTA 1- Route of Use: Smoking Substance #2 Name of Substance 2: Alcohol pt renders limited hx states "he drinks" 2 - Age of First Use: UTA 2 - Amount (size/oz): UTA 2 - Frequency: UTA 2 - Duration: UTA 2 - Last Use / Amount: UTA although patient's BAL was 20 on arrival 2 - Method of Aquiring: UTA 2 - Route of Substance Use: Oral                      ASAM's:  Six Dimensions of Multidimensional Assessment  Dimension 1:  Acute Intoxication and/or Withdrawal Potential:      Dimension 2:  Biomedical Conditions and Complications:      Dimension 3:  Emotional, Behavioral, or Cognitive Conditions and Complications:     Dimension 4:  Readiness to Change:     Dimension 5:  Relapse, Continued use, or Continued Problem Potential:     Dimension 6:  Recovery/Living Environment:     ASAM Severity Score:    ASAM Recommended Level of Treatment:     Substance use Disorder (SUD)    Recommendations for Services/Supports/Treatments:    Discharge Disposition:    DSM5 Diagnoses: Patient Active Problem List   Diagnosis Date Noted   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

## 2022-02-09 DIAGNOSIS — R45850 Homicidal ideations: Secondary | ICD-10-CM

## 2022-02-09 MED ORDER — QUETIAPINE FUMARATE ER 200 MG PO TB24
200.0000 mg | ORAL_TABLET | Freq: Every day | ORAL | Status: DC
  Administered 2022-02-09: 200 mg via ORAL
  Filled 2022-02-09 (×3): qty 1

## 2022-02-09 MED ORDER — DIVALPROEX SODIUM 500 MG PO DR TAB
500.0000 mg | DELAYED_RELEASE_TABLET | Freq: Two times a day (BID) | ORAL | Status: DC
  Administered 2022-02-09 – 2022-02-10 (×3): 500 mg via ORAL
  Filled 2022-02-09 (×3): qty 1

## 2022-02-09 NOTE — ED Notes (Addendum)
Pt asked if he could take a shower, since he has been sweaty today. Pt has been calm and cooperative. Given towels, soap and new scrubs so he can shower. Pt now showering at this time.

## 2022-02-09 NOTE — ED Notes (Signed)
Alexis Castaneda request sandwich and something to tide him over until dinner. States he is constantly hungry related to medications that he is taking. Alexis Castaneda given sandwich and soda.
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
## 2022-02-09 NOTE — ED Provider Notes (Signed)
Emergency Medicine Observation Re-evaluation Note  Alexis Castaneda is a 61 y.o. male, seen on rounds today.  Pt initially presented to the ED for complaints of Suicidal and Homicidal Currently, the patient is resting.  Physical Exam  BP 120/74 (BP Location: Left Arm)   Pulse 71   Temp 98.4 F (36.9 C) (Oral)   Resp 18   SpO2 100%  Physical Exam General: asleep Cardiac: rrr Lungs: cta Psych: calm  ED Course / MDM  EKG:   I have reviewed the labs performed to date as well as medications administered while in observation.  Recent changes in the last 24 hours include TTS eval.  Inpatient psych treatment still recommended  Plan  Current plan is for inpatient psych placement.  Alexis Castaneda is not under involuntary commitment.     Alexis Lefevre, MD 02/09/22 1620

## 2022-02-09 NOTE — ED Notes (Signed)
Home med list updated in system. Pt reports he has not been on Sertraline or Wellbutrin for more than a month, but MAR shows Depakote and Seroquel are active orders while here.

## 2022-02-09 NOTE — ED Notes (Signed)
Pt very calm and cooperative at this time, in conversation he states he voluntarily checked himself in and needs to be back on his home regimen of meds. Collecting and updating his home med list at this time. Will notify the doc, and see if meds can be restarted.

## 2022-02-09 NOTE — ED Notes (Addendum)
Verbal report given to Katrina W RN at this time 

## 2022-02-09 NOTE — ED Notes (Signed)
Assumed care of pt. He is currently resting in bed, finished dinner tray at bedside.

## 2022-02-10 ENCOUNTER — Inpatient Hospital Stay (HOSPITAL_COMMUNITY)
Admission: AD | Admit: 2022-02-10 | Discharge: 2022-02-15 | DRG: 885 | Disposition: A | Discharge status: Home / Self Care | Payer: Federal, State, Local not specified - Other | Source: Intra-hospital | Attending: Psychiatry | Admitting: Psychiatry

## 2022-02-10 ENCOUNTER — Encounter (HOSPITAL_COMMUNITY): Payer: Self-pay | Admitting: Nurse Practitioner

## 2022-02-10 ENCOUNTER — Other Ambulatory Visit: Payer: Self-pay

## 2022-02-10 DIAGNOSIS — R45850 Homicidal ideations: Secondary | ICD-10-CM | POA: Diagnosis present

## 2022-02-10 DIAGNOSIS — B1920 Unspecified viral hepatitis C without hepatic coma: Secondary | ICD-10-CM | POA: Diagnosis present

## 2022-02-10 DIAGNOSIS — Z23 Encounter for immunization: Secondary | ICD-10-CM | POA: Diagnosis not present

## 2022-02-10 DIAGNOSIS — R45851 Suicidal ideations: Secondary | ICD-10-CM | POA: Diagnosis present

## 2022-02-10 DIAGNOSIS — Z20822 Contact with and (suspected) exposure to covid-19: Secondary | ICD-10-CM | POA: Diagnosis present

## 2022-02-10 DIAGNOSIS — F319 Bipolar disorder, unspecified: Secondary | ICD-10-CM | POA: Diagnosis not present

## 2022-02-10 DIAGNOSIS — Z87820 Personal history of traumatic brain injury: Secondary | ICD-10-CM | POA: Diagnosis not present

## 2022-02-10 DIAGNOSIS — F3130 Bipolar disorder, current episode depressed, mild or moderate severity, unspecified: Principal | ICD-10-CM | POA: Diagnosis present

## 2022-02-10 DIAGNOSIS — F1590 Other stimulant use, unspecified, uncomplicated: Secondary | ICD-10-CM

## 2022-02-10 DIAGNOSIS — F4010 Social phobia, unspecified: Secondary | ICD-10-CM | POA: Diagnosis present

## 2022-02-10 DIAGNOSIS — F4310 Post-traumatic stress disorder, unspecified: Secondary | ICD-10-CM | POA: Diagnosis present

## 2022-02-10 DIAGNOSIS — R456 Violent behavior: Secondary | ICD-10-CM | POA: Diagnosis present

## 2022-02-10 DIAGNOSIS — F411 Generalized anxiety disorder: Secondary | ICD-10-CM | POA: Diagnosis present

## 2022-02-10 DIAGNOSIS — F1510 Other stimulant abuse, uncomplicated: Secondary | ICD-10-CM | POA: Diagnosis present

## 2022-02-10 DIAGNOSIS — Z560 Unemployment, unspecified: Secondary | ICD-10-CM

## 2022-02-10 DIAGNOSIS — D869 Sarcoidosis, unspecified: Secondary | ICD-10-CM | POA: Diagnosis present

## 2022-02-10 DIAGNOSIS — Z5900 Homelessness unspecified: Secondary | ICD-10-CM

## 2022-02-10 DIAGNOSIS — G4700 Insomnia, unspecified: Secondary | ICD-10-CM | POA: Diagnosis present

## 2022-02-10 DIAGNOSIS — F17210 Nicotine dependence, cigarettes, uncomplicated: Secondary | ICD-10-CM | POA: Diagnosis present

## 2022-02-10 DIAGNOSIS — R4587 Impulsiveness: Secondary | ICD-10-CM | POA: Diagnosis present

## 2022-02-10 DIAGNOSIS — I252 Old myocardial infarction: Secondary | ICD-10-CM | POA: Diagnosis not present

## 2022-02-10 DIAGNOSIS — F603 Borderline personality disorder: Secondary | ICD-10-CM

## 2022-02-10 DIAGNOSIS — Z91148 Patient's other noncompliance with medication regimen for other reason: Secondary | ICD-10-CM | POA: Diagnosis not present

## 2022-02-10 LAB — SARS CORONAVIRUS 2 BY RT PCR: SARS Coronavirus 2 by RT PCR: NEGATIVE

## 2022-02-10 MED ORDER — MAGNESIUM HYDROXIDE 400 MG/5ML PO SUSP: 30.0000 mL | Freq: Every day | ORAL | Status: DC | PRN

## 2022-02-10 MED ORDER — PNEUMOCOCCAL 20-VAL CONJ VACC 0.5 ML IM SUSY
0.5000 mL | PREFILLED_SYRINGE | INTRAMUSCULAR | Status: AC
  Administered 2022-02-11: 0.5 mL via INTRAMUSCULAR
  Filled 2022-02-10: qty 0.5

## 2022-02-10 MED ORDER — ONDANSETRON HCL 4 MG PO TABS: 4.0000 mg | ORAL_TABLET | Freq: Three times a day (TID) | ORAL | Status: DC | PRN

## 2022-02-10 MED ORDER — IBUPROFEN 400 MG PO TABS
400.0000 mg | ORAL_TABLET | Freq: Once | ORAL | Status: AC
Start: 2022-02-10 — End: 2022-02-10
  Administered 2022-02-10: 400 mg via ORAL
  Filled 2022-02-10 (×2): qty 1

## 2022-02-10 MED ORDER — ACETAMINOPHEN 325 MG PO TABS: 650.0000 mg | ORAL_TABLET | ORAL | Status: DC | PRN

## 2022-02-10 MED ORDER — QUETIAPINE FUMARATE ER 200 MG PO TB24
200.0000 mg | ORAL_TABLET | Freq: Every day | ORAL | Status: DC
  Administered 2022-02-10: 200 mg via ORAL
  Filled 2022-02-10 (×4): qty 1

## 2022-02-10 MED ORDER — DIVALPROEX SODIUM 500 MG PO DR TAB
500.0000 mg | DELAYED_RELEASE_TABLET | Freq: Two times a day (BID) | ORAL | Status: DC
  Administered 2022-02-10: 500 mg via ORAL
  Filled 2022-02-10 (×7): qty 1

## 2022-02-10 MED ORDER — NICOTINE 21 MG/24HR TD PT24
21.0000 mg | MEDICATED_PATCH | Freq: Every day | TRANSDERMAL | Status: DC
  Filled 2022-02-10 (×3): qty 1

## 2022-02-10 MED ORDER — ENSURE ENLIVE PO LIQD
237.0000 mL | Freq: Two times a day (BID) | ORAL | Status: DC
  Administered 2022-02-11 – 2022-02-15 (×9): 237 mL via ORAL
  Filled 2022-02-10 (×13): qty 237

## 2022-02-10 MED ORDER — ALUM & MAG HYDROXIDE-SIMETH 200-200-20 MG/5ML PO SUSP: 30.0000 mL | Freq: Four times a day (QID) | ORAL | Status: DC | PRN

## 2022-02-10 NOTE — Progress Notes (Signed)
CSW spoke with [facility] access center, it was reported that currently there are no available adult male or Geri beds.   Crissie Reese, MSW, Lenice Pressman Phone: [phone] Disposition/TOC

## 2022-02-10 NOTE — ED Provider Notes (Addendum)
Emergency Medicine Observation Re-evaluation Note  Jacek Colson is a 61 y.o. male, seen on rounds today.  Pt initially presented to the ED for complaints of Suicidal and Homicidal Currently, the patient is asleep.  Physical Exam  BP 99/69 (BP Location: Left Arm)   Pulse 62   Temp 97.9 F (36.6 C) (Oral)   Resp 18   SpO2 99%  Physical Exam General: asleep Cardiac: rr Lungs: breathing well Psych: calm  ED Course / MDM  EKG:   I have reviewed the labs performed to date as well as medications administered while in observation.  Recent changes in the last 24 hours include none.  Plan  Current plan is for inpatient placement.  Oshua Mcconaha is not under involuntary commitment.  Pt has been accepted to [facility].  He remains stable for transfer.     Jacalyn Lefevre, MD 02/10/22 1137    Jacalyn Lefevre, MD 02/10/22 [phone]

## 2022-02-10 NOTE — Tx Team (Signed)
Initial Treatment Plan 02/10/2022 8:29 PM Alexis Castaneda AGT:[id number]    PATIENT STRESSORS: Financial difficulties   Health problems   Medication change or noncompliance   Substance abuse     PATIENT STRENGTHS: Ability for insight  Active sense of humor  Work skills    PATIENT IDENTIFIED PROBLEMS: Cocaine abuse  Etoh abuse   Homeless  SI               DISCHARGE CRITERIA:  Adequate post-discharge living arrangements Withdrawal symptoms are absent or subacute and managed without 24-hour nursing intervention  PRELIMINARY DISCHARGE PLAN: Placement in alternative living arrangements  PATIENT/FAMILY INVOLVEMENT: This treatment plan has been presented to and reviewed with the patient, Alexis Castaneda, and/or family member.  The patient and family have been given the opportunity to ask questions and make suggestions.  Floyce Stakes, RN 02/10/2022, 8:29 PM

## 2022-02-10 NOTE — Progress Notes (Signed)
Patient is a 61 year old male who presented to [facility] voluntarily from [facility] for SI and HI. Pt reported that he has been homeless since 2020, and was needing help with housing and medication management. Pt presented with a sad affect and calm, cooperative behavior, answered questions logically and coherently during admission interview and assessment. VS monitored and recorded. Skin check performed with Quintella Reichert. Belongings searched and secured in locker.  Patient was oriented to unit and schedule.  Pt denies SI/HI/AVH at this time. PO fluids and dinner provided. Q 15 min checks initiated for safety

## 2022-02-10 NOTE — Progress Notes (Signed)
Pt did not attend the Wrap Up group.

## 2022-02-10 NOTE — ED Notes (Signed)
Patient called this nurse to room asking about plan, stating he hates talking to providers on the computer and wants to speak to someone face to face. Pt upset about current situation. He states he saw a psychiatrist for years and then moved to [place] and has since moved back but is off his meds and wants to reestablish care with a psychiatrist and case management. This nurse explained that we were the holding facility, that the plan is for him to go to an inpatient facility where his medications can be restarted and care can be established. Pt states this relieved anxiety for him and was thankful for update. Pt explained how he gets easily agitated and that his medications are important to help stabilize him. Medications given and no other needs at this time.

## 2022-02-10 NOTE — ED Notes (Signed)
Report called to [facility] RN at [facility] 400

## 2022-02-10 NOTE — Progress Notes (Addendum)
Skin Assessment: Vikki Ports RN   and   [facility] RN  Feet, dry skin Bilateral legs, dry skin, large dark areas on legs, back, abdomen. Patient stated these areas itch.  Has cream that he uses for itching.  Cream was given to patient during his last stay at [facility]   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   Fall risk information given patient, high fall risk. Patient given food/drink, oriented to [address] hall.

## 2022-02-10 NOTE — ED Notes (Signed)
[facility] here for transport of pt. Belongings given to [employer]. Pt ambulated out to vehicle with this nurse in stable condition
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
## 2022-02-11 ENCOUNTER — Encounter (HOSPITAL_COMMUNITY): Payer: Self-pay

## 2022-02-11 DIAGNOSIS — F603 Borderline personality disorder: Secondary | ICD-10-CM

## 2022-02-11 DIAGNOSIS — F1590 Other stimulant use, unspecified, uncomplicated: Secondary | ICD-10-CM

## 2022-02-11 DIAGNOSIS — F4310 Post-traumatic stress disorder, unspecified: Secondary | ICD-10-CM

## 2022-02-11 MED ORDER — LORAZEPAM 1 MG PO TABS: 1.0000 mg | ORAL_TABLET | Freq: Four times a day (QID) | ORAL | Status: DC | PRN

## 2022-02-11 MED ORDER — HYDROXYZINE HCL 25 MG PO TABS
25.0000 mg | ORAL_TABLET | Freq: Four times a day (QID) | ORAL | Status: DC | PRN
Start: 2022-02-11 — End: 2022-02-15
  Administered 2022-02-11 – 2022-02-12 (×4): 25 mg via ORAL
  Filled 2022-02-11 (×2): qty 1
  Filled 2022-02-11: qty 10
  Filled 2022-02-11 (×2): qty 1

## 2022-02-11 MED ORDER — OLANZAPINE 10 MG PO TBDP: 10.0000 mg | ORAL_TABLET | Freq: Three times a day (TID) | ORAL | Status: DC | PRN

## 2022-02-11 MED ORDER — LORAZEPAM 1 MG PO TABS: 1.0000 mg | ORAL_TABLET | ORAL | Status: DC | PRN

## 2022-02-11 MED ORDER — IBUPROFEN 400 MG PO TABS
400.0000 mg | ORAL_TABLET | Freq: Four times a day (QID) | ORAL | Status: DC | PRN
  Administered 2022-02-11 – 2022-02-14 (×3): 400 mg via ORAL
  Filled 2022-02-11 (×3): qty 1

## 2022-02-11 MED ORDER — ZIPRASIDONE MESYLATE 20 MG IM SOLR: 20.0000 mg | INTRAMUSCULAR | Status: DC | PRN

## 2022-02-11 MED ORDER — QUETIAPINE FUMARATE ER 50 MG PO TB24
250.0000 mg | ORAL_TABLET | Freq: Every day | ORAL | Status: DC
  Administered 2022-02-11: 250 mg via ORAL
  Filled 2022-02-11 (×4): qty 1

## 2022-02-11 MED ORDER — ZIPRASIDONE MESYLATE 20 MG IM SOLR: 20.0000 mg | Freq: Four times a day (QID) | INTRAMUSCULAR | Status: DC | PRN

## 2022-02-11 MED ORDER — METHOCARBAMOL 500 MG PO TABS
500.0000 mg | ORAL_TABLET | Freq: Four times a day (QID) | ORAL | Status: DC | PRN
  Administered 2022-02-12 (×2): 500 mg via ORAL
  Filled 2022-02-11 (×2): qty 1

## 2022-02-11 NOTE — Progress Notes (Signed)
Pt denies SI/HI/AVH and verbally agrees to approach staff if these become apparent or before harming themselves/others. Rates depression 8/10. Rates anxiety 8/10. Rates pain 9/10.  Pt has gone to some groups today and has been seen in the dayroom multiple times. Pt is being more interactive and participating more. Pt goal is to find the right direction to take and mind his own business. Scheduled medications administered to pt, per MD orders. RN provided support and encouragement to pt. Q15 min safety checks implemented and continued. Pt safe on the unit. RN will continue to monitor and intervene as needed.   02/11/22 0947  Psych Admission Type (Psych Patients Only)  Admission Status Voluntary  Psychosocial Assessment  Patient Complaints Anxiety;Depression  Eye Contact Brief  Facial Expression Anxious;Sad  Affect Anxious;Sad  Speech Logical/coherent  Interaction Assertive  Motor Activity Slow  Appearance/Hygiene Unremarkable  Behavior Characteristics Cooperative;Appropriate to situation;Anxious  Mood Anxious;Depressed;Sad;Pleasant  Thought Process  Coherency WDL  Content WDL  Delusions None reported or observed  Perception WDL  Hallucination None reported or observed  Judgment Limited  Confusion None  Danger to Self  Current suicidal ideation? Denies ("not now")  Agreement Not to Harm Self Yes  Description of Agreement verbal  Danger to Others  Danger to Others None reported or observed

## 2022-02-11 NOTE — [facility] Group Notes (Signed)
Spiritual care group on grief and loss facilitated by chaplain Katy Ellianah Cordy, [facility]   Group Goal:   Support / Education around grief and loss   Members engage in facilitated group support and psycho-social education.   Group Description:   Following introductions and group rules, group members engaged in facilitated group dialog and support around topic of loss, with particular support around experiences of loss in their lives. Group Identified types of loss (relationships / self / things) and identified patterns, circumstances, and changes that precipitate losses. Reflected on thoughts / feelings around loss, normalized grief responses, and recognized variety in grief experience. Group noted Worden's four tasks of grief in discussion.   Group drew on Adlerian / Rogerian, narrative, MI,   Patient Progress: Did not attend.  

## 2022-02-11 NOTE — Group Note (Signed)
Recreation Therapy Group Note   Group Topic:Stress Management  Group Date: 02/11/2022 Start Time: 0930 End Time: 0950 Facilitators: Caroll Rancher, [place] Location: [address] Dayroom   Goal Area(s) Addresses:  Patient will identify positive stress management techniques. Patient will identify benefits of using stress management post d/c.  Group Description:  Meditation.  LRT played a meditation that focused on being grounded, grateful and intentional for the day ahead.  Patients were to listen to the meditation and focus on centering themselves and allowing the energy to flow throughout their bodies to become relaxed and open to what the day ahead brings.     Affect/Mood: N/A   Participation Level: Did not attend    Clinical Observations/Individualized Feedback:     Plan: Continue to engage patient in RT group sessions 2-3x/week.   Caroll Rancher, LRT,CTRS  02/11/2022 11:43 AM

## 2022-02-11 NOTE — Progress Notes (Signed)
   02/11/22 1945  Psych Admission Type (Psych Patients Only)  Admission Status Voluntary  Psychosocial Assessment  Patient Complaints Anxiety  Eye Contact Fair  Facial Expression Anxious  Affect Anxious  Speech Logical/coherent  Interaction Assertive  Motor Activity Slow  Appearance/Hygiene Unremarkable  Behavior Characteristics Cooperative  Mood Anxious  Aggressive Behavior  Effect No apparent injury  Thought Process  Coherency WDL  Content WDL  Delusions WDL  Perception WDL  Hallucination None reported or observed  Judgment Limited  Confusion None  Danger to Self  Current suicidal ideation? Denies  Danger to Others  Danger to Others None reported or observed

## 2022-02-11 NOTE — BH IP Treatment Plan (Signed)
Interdisciplinary Treatment and Diagnostic Plan Update  02/11/2022 Time of Session: 1030 Alexis Castaneda MRN: [id number]  Principal Diagnosis: Bipolar 1 disorder, depressed (HCC)  Secondary Diagnoses: Principal Problem:   Bipolar 1 disorder, depressed (HCC)   Current Medications:  Current Facility-Administered Medications  Medication Dose Route Frequency Provider Last Rate Last Admin   alum & mag hydroxide-simeth (MAALOX/MYLANTA) 200-200-20 MG/5ML suspension 30 mL  30 mL Oral Q6H PRN Eligha Bridegroom, NP       divalproex (DEPAKOTE) DR tablet 500 mg  500 mg Oral Q12H Eligha Bridegroom, NP   500 mg at 02/10/22 2108   feeding supplement (ENSURE ENLIVE / ENSURE PLUS) liquid 237 mL  237 mL Oral BID BM Massengill, Harrold Donath, MD   237 mL at 02/11/22 1437   hydrOXYzine (ATARAX) tablet 25 mg  25 mg Oral Q6H PRN Lorri Frederick, MD   25 mg at 02/11/22 0947   ibuprofen (ADVIL) tablet 400 mg  400 mg Oral Q6H PRN Phineas Inches, MD   400 mg at 02/11/22 1040   magnesium hydroxide (MILK OF MAGNESIA) suspension 30 mL  30 mL Oral Daily PRN Eligha Bridegroom, NP       methocarbamol (ROBAXIN) tablet 500 mg  500 mg Oral Q6H PRN Massengill, Harrold Donath, MD       nicotine (NICODERM CQ - dosed in mg/24 hours) patch 21 mg  21 mg Transdermal Daily Eligha Bridegroom, NP       ondansetron (ZOFRAN) tablet 4 mg  4 mg Oral Q8H PRN Eligha Bridegroom, NP       QUEtiapine (SEROQUEL XR) 24 hr tablet 200 mg  200 mg Oral QHS Eligha Bridegroom, NP   200 mg at 02/10/22 2108   PTA Medications: Medications Prior to Admission  Medication Sig Dispense Refill Last Dose   divalproex (DEPAKOTE ER) 500 MG 24 hr tablet Take 500 mg by mouth daily.   02/09/2022   QUEtiapine (SEROQUEL) 400 MG tablet Take 400 mg by mouth at bedtime.   02/09/2022   buPROPion (WELLBUTRIN XL) 300 MG 24 hr tablet Take 300 mg by mouth daily.   Unknown   sertraline (ZOLOFT) 100 MG tablet Take 100 mg by mouth daily.   Unknown    Patient Stressors: Financial  difficulties   Health problems   Medication change or noncompliance   Substance abuse    Patient Strengths: Ability for insight  Active sense of humor  Work skills   Treatment Modalities: Medication Management, Group therapy, Case management,  1 to 1 session with clinician, Psychoeducation, Recreational therapy.   Physician Treatment Plan for Primary Diagnosis: Bipolar 1 disorder, depressed (HCC) Long Term Goal(s):     Short Term Goals:    Medication Management: Evaluate patient's response, side effects, and tolerance of medication regimen.  Therapeutic Interventions: 1 to 1 sessions, Unit Group sessions and Medication administration.  Evaluation of Outcomes: Progressing  Physician Treatment Plan for Secondary Diagnosis: Principal Problem:   Bipolar 1 disorder, depressed (HCC)  Long Term Goal(s):     Short Term Goals:       Medication Management: Evaluate patient's response, side effects, and tolerance of medication regimen.  Therapeutic Interventions: 1 to 1 sessions, Unit Group sessions and Medication administration.  Evaluation of Outcomes: Progressing   RN Treatment Plan for Primary Diagnosis: Bipolar 1 disorder, depressed (HCC) Long Term Goal(s): Knowledge of disease and therapeutic regimen to maintain health will improve  Short Term Goals: Ability to remain free from injury will improve, Ability to verbalize frustration and anger appropriately will improve,  Ability to demonstrate self-control, Ability to participate in decision making will improve, Ability to verbalize feelings will improve, Ability to disclose and discuss suicidal ideas, Ability to identify and develop effective coping behaviors will improve, and Compliance with prescribed medications will improve  Medication Management: RN will administer medications as ordered by provider, will assess and evaluate patient's response and provide education to patient for prescribed medication. RN will report any  adverse and/or side effects to prescribing provider.  Therapeutic Interventions: 1 on 1 counseling sessions, Psychoeducation, Medication administration, Evaluate responses to treatment, Monitor vital signs and CBGs as ordered, Perform/monitor CIWA, COWS, AIMS and Fall Risk screenings as ordered, Perform wound care treatments as ordered.  Evaluation of Outcomes: Progressing   LCSW Treatment Plan for Primary Diagnosis: Bipolar 1 disorder, depressed (HCC) Long Term Goal(s): Safe transition to appropriate next level of care at discharge, Engage patient in therapeutic group addressing interpersonal concerns.  Short Term Goals: Engage patient in aftercare planning with referrals and resources, Increase social support, Increase ability to appropriately verbalize feelings, Increase emotional regulation, Facilitate acceptance of mental health diagnosis and concerns, Facilitate patient progression through stages of change regarding substance use diagnoses and concerns, Identify triggers associated with mental health/substance abuse issues, and Increase skills for wellness and recovery  Therapeutic Interventions: Assess for all discharge needs, 1 to 1 time with Social worker, Explore available resources and support systems, Assess for adequacy in community support network, Educate family and significant other(s) on suicide prevention, Complete Psychosocial Assessment, Interpersonal group therapy.  Evaluation of Outcomes: Progressing   Progress in Treatment: Attending groups: Yes. Participating in groups: Yes. Taking medication as prescribed: Yes. Toleration medication: Yes. Family/Significant other contact made: No, will contact:  Patient has declined consent for CSW to reach family/friend. Patient understands diagnosis: Yes. Discussing patient identified problems/goals with staff: Yes. Medical problems stabilized or resolved: Yes. Denies suicidal/homicidal ideation: No. Issues/concerns per patient  self-inventory: Yes. Other: none  New problem(s) identified: No, Describe:  none  New Short Term/Long Term Goal(s): Patient to work towards detox, medication management for mood stabilization; elimination of SI thoughts; development of comprehensive mental wellness/sobriety plan.  Patient Goals:  Patient states he would like to "get to a 30 day program and get back on meds. I do not want to break my nephew's neck."   Discharge Plan or Barriers:  No psychosocial barriers identified at this time, patient to return to place of residence when appropriate for discharge.   Reason for Continuation of Hospitalization: Depression  Estimated Length of Stay: 1-7 days   Last 3 [place] Suicide Severity Risk Score: Flowsheet Row ED from 02/07/2022 in [facility] EMERGENCY DEPARTMENT ED from 04/03/2021 in [facility] EMERGENCY DEPARTMENT ED from 03/22/2021 in [facility] EMERGENCY DEPARTMENT  C-SSRS RISK CATEGORY High Risk No Risk No Risk        Scribe for Treatment Team: Almedia Balls 02/11/2022 3:27 PM
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
## 2022-02-11 NOTE — [facility] Suicide Risk Assessment (Signed)
[facility] INPATIENT:  Family/Significant Other Suicide Prevention Education  Suicide Prevention Education:  Patient Refusal for Family/Significant Other Suicide Prevention Education: The patient Alexis Castaneda has refused to provide written consent for family/significant other to be provided Family/Significant Other Suicide Prevention Education during admission and/or prior to discharge.  Physician notified.  [place] Kung Pershing [place] 02/11/2022, 2:22 PM

## 2022-02-11 NOTE — [facility] Counselor (Signed)
CSW was able to view previous, public court records from [facility] and [place] and also contacted the [place] of Court to ask about Guardianship of the Pt.  CSW discovered that the Pt had a legal guardian in [place] in 1997 Alexis Castaneda) but there is no phone number for that Guardian.  CSW also discovered that the Pt had a Legal Guardian in [place] in 2014 Alexis Castaneda) but the court terminated the Guardianship in 2016.  It appears that the Pt is his own Guardian at this moment.  Pt declined all SPE's and family contact.

## 2022-02-11 NOTE — Group Note (Signed)
LCSW Group Therapy Note   Group Date: 02/11/2022 Start Time: 1300 End Time: 1400  Type of Therapy and Topic:  Group Therapy: Thoughts, Feelings, and Actions  Participation Level:  Did Not Attend   Description of Group:   In this group, each patient discussed their previous experiencing and understanding of overthinking, identifying the harmful impact on their lives. As a group, each patient was introduced to the basic concepts of Cognitive Behavioral Therapy: that thoughts, feelings, and actions are all connected and influence one another. They were given examples of how overthinking can affect our feelings, actions, and vise versa. The group was then asked to analyze how overthinking was harmful and brainstorm alternative thinking patterns/reactions to the example situation. Then, each group member filled out and identified their own example situation in which a problem situation caused their thoughts, feelings, and actions to be negatively impacted; they were asked to come up with 3 new (more adaptive/positive) thoughts that led to 3 new feelings and actions.  Therapeutic Goals: Patients will review and discuss their past experience with overthinking. Patients will learn the basics of the CBT model through group-led examples.. Patients will identify situations where they may have negative thoughts, feelings, or actions and will then reframe the situation using more positive thoughts to react differently.  Summary of Patient Progress:  Did not attend   Therapeutic Modalities:   Cognitive Behavioral Therapy Mindfulness  Aram Beecham, [place] 02/11/2022  2:10 PM
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
## 2022-02-11 NOTE — Progress Notes (Signed)
Adult Psychoeducational Group Note  Date:  02/11/2022 Time:  1:25 PM  Group Topic/Focus:  Goals Group:   The focus of this group is to help patients establish daily goals to achieve during treatment and discuss how the patient can incorporate goal setting into their daily lives to aide in recovery.  Participation Level:  Active  Participation Quality:  Appropriate  Affect:  Appropriate  Cognitive:  Appropriate  Insight: Appropriate  Engagement in Group:  Engaged  Modes of Intervention:  Discussion  Additional Comments:  Patient attended morning orientation group and participated.   Alexis Castaneda 02/11/2022, 1:25 PM

## 2022-02-11 NOTE — Group Note (Signed)
Occupational Therapy Group Note  Group Topic:Stress Management  Group Date: 02/11/2022 Start Time: 1400 End Time: 1445 Facilitators: Ted Mcalpine, OT   Group Description: Group encouraged increased participation and engagement through discussion focused on topic of stress management. Patients engaged interactively to discuss components of stress including physical signs, emotional signs, negative management strategies, and positive management strategies. Each individual identified one new stress management strategy they would like to try moving forward.    Therapeutic Goals: Identify current stressors Identify healthy vs unhealthy stress management strategies/techniques Discuss and identify physical and emotional signs of stress   Participation Level: Active and Engaged   Participation Quality: Independent   Behavior: Appropriate   Speech/Thought Process: Coherent and Directed   Affect/Mood: Appropriate   Insight: Fair   Judgement: Fair   Individualization: pt was active in their participation of group discussion/activity. New skills were identified  Modes of Intervention: Discussion and Education  Patient Response to Interventions:  Attentive and Engaged   Plan: Continue to engage patient in OT groups 2 - 3x/week.  02/11/2022  Ted Mcalpine, OT Kerrin Champagne, OT
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
## 2022-02-11 NOTE — BHH Counselor (Signed)
CSW provided the Pt with a packet that contains information including shelter and housing resources, free and reduced price food information, clothing resources, crisis center information, a GoodRX card, and suicide prevention information.   

## 2022-02-11 NOTE — Progress Notes (Addendum)
Patient is a 61 yr old male admitted voluntarily for SI, HI, substance abuse and auditory hallucinations. Patient reports using [phone] dollars since last Wednesday. He reports that he is currently homeless after relocating from [place] to [place] to help care for his mom. He was receiving mental health benefits  from [place] but they were stopped and has not been taking able to get his medications. Skin assessment and vitals were completed by [facility].

## 2022-02-12 LAB — COMPREHENSIVE METABOLIC PANEL
ALT: 24 U/L (ref 0–44)
AST: 20 U/L (ref 15–41)
Albumin: 3.5 g/dL (ref 3.5–5.0)
Alkaline Phosphatase: 65 U/L (ref 38–126)
Anion gap: 7 (ref 5–15)
BUN: 13 mg/dL (ref 8–23)
CO2: 27 mmol/L (ref 22–32)
Calcium: 9.3 mg/dL (ref 8.9–10.3)
Chloride: 107 mmol/L (ref 98–111)
Creatinine, Ser: 0.89 mg/dL (ref 0.61–1.24)
GFR, Estimated: >60 mL/min (ref >60)
Glucose, Bld: 93 mg/dL (ref 70–99)
Potassium: 4.1 mmol/L (ref 3.5–5.1)
Sodium: 141 mmol/L (ref 135–145)
Total Bilirubin: 0.3 mg/dL (ref 0.3–1.2)
Total Protein: 6.3 g/dL — ABNORMAL LOW (ref 6.5–8.1)

## 2022-02-12 MED ORDER — QUETIAPINE FUMARATE 300 MG PO TABS
300.0000 mg | ORAL_TABLET | Freq: Every day | ORAL | Status: DC
  Administered 2022-02-12 – 2022-02-14 (×3): 300 mg via ORAL
  Filled 2022-02-12 (×2): qty 1
  Filled 2022-02-12: qty 7
  Filled 2022-02-12 (×3): qty 1

## 2022-02-12 MED ORDER — QUETIAPINE FUMARATE 50 MG PO TABS: 50.0000 mg | ORAL_TABLET | Freq: Two times a day (BID) | ORAL | Status: DC | PRN

## 2022-02-12 NOTE — Progress Notes (Signed)
Nutrition Brief Note RD working remotely.  Patient identified on the Malnutrition Screening Tool (MST) Report.  Wt Readings from Last 15 Encounters:  02/10/22 64 kg  04/03/21 63.5 kg  03/22/21 61.8 kg  12/23/17 77.1 kg  11/04/17 72.6 kg  12/16/16 68 kg    Body mass index is 20.82 kg/m. Patient meets criteria for normal weight based on current BMI.   Current diet order is Regular and patient is eating as desired at meals and snacks at this time. Labs and medications reviewed.   Ensure Plus High Protein ordered BID per ONS protocol at the time of admission and patient has accepted both bottles offered so far. No nutrition interventions warranted at this time. If nutrition issues arise, please consult RD.       Trenton Gammon, MS, RD, LDN, CNSC Registered Dietitian II Inpatient Clinical Nutrition RD pager # and on-call/weekend pager # available in [facility]

## 2022-02-12 NOTE — Progress Notes (Signed)
Pt denies SI/HI/AVH and verbally agrees to approach staff if these become apparent or before harming themselves/others. Rates depression 6/10. Rates anxiety 6/10. Rates pain 8/10. Pt has been irritable throughout the day due to being in pain and not knowing why. Pt did apologize for being irritable in the morning but then was again later in the evening. Scheduled medications administered to pt, per MD orders. RN provided support and encouragement to pt. Q15 min safety checks implemented and continued. Pt safe on the unit. RN will continue to monitor and intervene as needed.   02/12/22 0810  Psych Admission Type (Psych Patients Only)  Admission Status Voluntary  Psychosocial Assessment  Patient Complaints Anxiety;Depression;Irritability  Eye Contact Avoids  Facial Expression Anxious  Affect Anxious;Irritable  Speech Logical/coherent  Interaction Assertive  Motor Activity Slow  Appearance/Hygiene Unremarkable  Behavior Characteristics Anxious;Irritable  Mood Anxious;Depressed;Irritable  Thought Process  Coherency WDL  Content WDL  Delusions None reported or observed  Perception WDL  Hallucination None reported or observed  Judgment Limited  Confusion None  Danger to Self  Current suicidal ideation? Denies  Danger to Others  Danger to Others None reported or observed
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
## 2022-02-12 NOTE — Progress Notes (Signed)
   02/12/22 0545  Sleep  Number of Hours 7

## 2022-02-12 NOTE — BHH Group Notes (Signed)
Pt attended wrap up group 

## 2022-02-12 NOTE — BHH Group Notes (Signed)
Patient did not attend group.

## 2022-02-12 NOTE — Progress Notes (Signed)
   02/12/22 2045  Psych Admission Type (Psych Patients Only)  Admission Status Voluntary  Psychosocial Assessment  Patient Complaints Anxiety  Eye Contact Fair  Facial Expression Anxious  Affect Anxious  Speech Logical/coherent  Interaction Assertive  Motor Activity Slow  Appearance/Hygiene Unremarkable  Behavior Characteristics Cooperative  Mood Anxious  Aggressive Behavior  Effect No apparent injury  Thought Process  Coherency WDL  Content WDL  Delusions WDL  Perception WDL  Hallucination None reported or observed  Judgment Limited  Confusion None  Danger to Self  Current suicidal ideation? Denies  Danger to Others  Danger to Others None reported or observed

## 2022-02-13 LAB — HEMOGLOBIN A1C
Hgb A1c MFr Bld: 5.5 % (ref 4.8–5.6)
Mean Plasma Glucose: 111.15 mg/dL

## 2022-02-13 LAB — LIPID PANEL
Cholesterol: 177 mg/dL (ref 0–200)
HDL: 53 mg/dL (ref >40)
LDL Cholesterol: 107 mg/dL — ABNORMAL HIGH (ref 0–99)
Total CHOL/HDL Ratio: 3.3 RATIO
Triglycerides: 84 mg/dL (ref <150)
VLDL: 17 mg/dL (ref 0–40)

## 2022-02-13 MED ORDER — SERTRALINE HCL 25 MG PO TABS
25.0000 mg | ORAL_TABLET | Freq: Every day | ORAL | Status: DC
  Administered 2022-02-13 – 2022-02-15 (×3): 25 mg via ORAL
  Filled 2022-02-13 (×4): qty 1
  Filled 2022-02-13: qty 7
  Filled 2022-02-13: qty 1
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
## 2022-02-13 NOTE — Progress Notes (Signed)
   02/13/22 0530  Sleep  Number of Hours 6

## 2022-02-13 NOTE — Group Note (Signed)
LCSW Group Therapy Note   Group Date: 02/13/2022 Start Time: 1300 End Time: 1400  Type of Therapy: Group Therapy: Boundaries  Participation Level: Active  Description of Group: This group will address the use of boundaries in their personal lives. Patients will explore why boundaries are important, the difference between healthy and unhealthy boundaries, and negative and positive outcomes of different boundaries and will look at how boundaries can be crossed.  Patients will be encouraged to identify current boundaries in their own lives and identify what kind of boundary is being set. Facilitators will guide patients in utilizing problem-solving interventions to address and correct types boundaries being used and to address when no boundary is being used. Understanding and applying boundaries will be explored and addressed for obtaining and maintaining a balanced life. Patients will be encouraged to explore ways to assertively make their boundaries and needs known to significant others in their lives, using other group members and facilitator for role play, support, and feedback.   Therapeutic Goals: 1. Patient will identify areas in their life where setting clear boundaries could be used to improve their life.  2. Patient will identify signs/triggers that a boundary is not being respected. 3. Patient will identify two ways to set boundaries in order to achieve balance in their lives: 4. Patient will demonstrate ability to communicate their needs and set boundaries through discussion and/or role plays   Summary of Progress/Problems: The Pt attended group and remained there the entire time.  The Pt accepted all worksheets and followed along throughout the group session.  The Pt participated in the group discussion and was appropriate with their peers.  The Pt was able to demonstrate understanding of boundaries and how to form those boundaries with others.   Aram Beecham, LCSWA 02/13/2022  2:01  PM

## 2022-02-13 NOTE — [facility] Group Notes (Signed)
Adult Psychoeducational Group Note  Date:  02/13/2022 Time:  10:45 AM  Group Topic/Focus:  Goals Group:   The focus of this group is to help patients establish daily goals to achieve during treatment and discuss how the patient can incorporate goal setting into their daily lives to aide in recovery.  Participation Level:  Active  Participation Quality:  Attentive  Affect:  Appropriate  Cognitive:  Appropriate  Insight: Appropriate  Engagement in Group:  Engaged  Modes of Intervention:  Discussion  Additional Comments:  Patient's goal was to get a discharge plan.   Alyra Patty T Kasidee Voisin 02/13/2022, 10:45 AM

## 2022-02-13 NOTE — [facility] Counselor (Signed)
CSW spoke with Marcelino Duster at [facility] and confirmed that the Pt has been accepted for an intake appointment on 02/18/2022 at 9:00am.  CSW will inform the providers and the Pt of this information.

## 2022-02-13 NOTE — Progress Notes (Signed)
Patient did attend the evening speaker NA meeting.  

## 2022-02-13 NOTE — Progress Notes (Signed)
   02/13/22 1400  Psych Admission Type (Psych Patients Only)  Admission Status Voluntary  Psychosocial Assessment  Patient Complaints Anxiety  Eye Contact Fair  Facial Expression Anxious  Affect Anxious  Speech Logical/coherent  Interaction Assertive  Motor Activity Slow  Appearance/Hygiene Unremarkable  Behavior Characteristics Cooperative  Mood Anxious  Aggressive Behavior  Effect No apparent injury  Thought Process  Coherency WDL  Content WDL  Delusions WDL  Perception WDL  Hallucination None reported or observed  Judgment Limited  Confusion None  Danger to Self  Current suicidal ideation? Denies  Danger to Others  Danger to Others None reported or observed

## 2022-02-13 NOTE — Group Note (Signed)
Recreation Therapy Group Note   Group Topic:Team Building  Group Date: 02/13/2022 Start Time: 0930 End Time: 1000 Facilitators: Caroll Rancher, LRT,CTRS Location: [address] Day[address]   Goal Area(s) Addresses:  Patient will effectively work with peer towards shared goal.  Patient will identify skills used to make activity successful.  Patient will identify how skills used during activity can be used to reach post d/c goals.    Group Description:  Straw Bridge. In teams of 3-5, patients were given 15 plastic drinking straws and an equal length of masking tape. Using the materials provided, patients were instructed to build a free standing bridge-like structure to suspend an everyday item (ex: puzzle box) off of the floor or table surface. All materials were required to be used by the team in their design. LRT facilitated post-activity discussion reviewing team process. Patients were encouraged to reflect how the skills used in this activity can be generalized to daily life post discharge.    Affect/Mood: Appropriate   Participation Level: Engaged   Participation Quality: Independent   Behavior: Appropriate   Speech/Thought Process: Focused   Insight: Good   Judgement: Good   Modes of Intervention: STEM Activity   Patient Response to Interventions:  Engaged   Education Outcome:  Acknowledges education and In group clarification offered    Clinical Observations/Individualized Feedback: Pt attended and participated in group session.     Plan: Continue to engage patient in RT group sessions 2-3x/week.   Caroll Rancher, LRT,CTRS 02/13/2022 12:06 PM

## 2022-02-14 LAB — SARS CORONAVIRUS 2 BY RT PCR: SARS Coronavirus 2 by RT PCR: NEGATIVE

## 2022-02-14 MED ORDER — GABAPENTIN 100 MG PO CAPS
100.0000 mg | ORAL_CAPSULE | Freq: Three times a day (TID) | ORAL | Status: DC
Start: 2022-02-14 — End: 2022-02-15
  Administered 2022-02-14 – 2022-02-15 (×3): 100 mg via ORAL
  Filled 2022-02-14 (×2): qty 1
  Filled 2022-02-14: qty 21
  Filled 2022-02-14 (×2): qty 1
  Filled 2022-02-14: qty 21
  Filled 2022-02-14 (×2): qty 1
  Filled 2022-02-14: qty 21

## 2022-02-14 NOTE — Progress Notes (Signed)
[facility] Group Notes:  (Nursing/MHT/Case Management/Adjunct)  Date:  02/14/2022  Time:  2000  Type of Therapy:   wrap up group  Participation Level:  Active  Participation Quality:  Appropriate, Inattentive, Resistant, Sharing, and Supportive  Affect:  Appropriate  Cognitive:  Alert  Insight:  Improving  Engagement in Group:  Engaged  Modes of Intervention:  Clarification, Education, and Support  Summary of Progress/Problems: Positive thinking and positive change were discussed.   Marcille Buffy 02/14/2022, 8:52 PM

## 2022-02-14 NOTE — Progress Notes (Signed)
   02/14/22 2000  Psych Admission Type (Psych Patients Only)  Admission Status Voluntary  Psychosocial Assessment  Patient Complaints Anxiety  Eye Contact Fair  Facial Expression Anxious  Affect Anxious  Speech Logical/coherent  Interaction Assertive  Motor Activity Slow  Appearance/Hygiene Unremarkable  Behavior Characteristics Appropriate to situation  Mood Anxious  Thought Process  Coherency WDL  Content WDL  Delusions None reported or observed  Perception WDL  Hallucination None reported or observed  Judgment Impaired  Confusion None  Danger to Self  Current suicidal ideation? Denies  Danger to Others  Danger to Others None reported or observed
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
## 2022-02-14 NOTE — Group Note (Signed)
Date:  02/14/2022 Time:  11:38 AM     Participation Level:  Active  Participation Quality:  Appropriate and Attentive  Affect:  Appropriate  Cognitive:  Alert  Insight: Appropriate  Engagement in Group:  Engaged  Modes of Intervention:  Discussion  Additional Comments:  Pts goal today was to maintain himself  Alexis Castaneda 02/14/2022, 11:38 AM

## 2022-02-14 NOTE — [facility] Group Notes (Signed)
[facility] Group Notes:  (Nursing/MHT/Case Management/Adjunct)  Date:  02/14/2022  Time:  2:50 PM  Type of Therapy:  Group Therapy  Participation Level:  Active  Participation Quality:  Appropriate  Affect:  Appropriate  Cognitive:  Appropriate  Insight:  Appropriate  Engagement in Group:  Engaged  Modes of Intervention:  Discussion  Summary of Progress/Problems:  Patient attended and participated in a relaxation group that focused on alleviating stress and anxiety by doing deep breathing activities and muscle relaxation activities.   Ceci Taliaferro R Nahdia Doucet 02/14/2022, 2:50 PM 

## 2022-02-14 NOTE — Progress Notes (Signed)
DAR NOTE: Patient presents with anxious affect and irritable mood.  Patient continues to reports lots of somatic symptoms such as blurred vision, cramping, tingling, rash, dizziness, chest, back and shoulders pain.  Vital signs result within normal limit.  MD made aware. Denies suicidal thoughts, auditory and visual hallucinations.  Rates depression at 4, hopelessness at 0, and anxiety at 4.  Maintained on routine safety checks.  Medications given as prescribed.  Support and encouragement offered as needed.  Attended group and participated.  States goal for today is "getting the right pain meds."  Patient visible in milieu with minimal interaction.  Patient is safe on the unit.

## 2022-02-15 DIAGNOSIS — F319 Bipolar disorder, unspecified: Secondary | ICD-10-CM

## 2022-02-15 MED ORDER — HYDROXYZINE HCL 25 MG PO TABS: 25.0000 mg | ORAL_TABLET | Freq: Four times a day (QID) | ORAL | 0 refills | Status: AC | PRN

## 2022-02-15 MED ORDER — QUETIAPINE FUMARATE 300 MG PO TABS: 300.0000 mg | ORAL_TABLET | Freq: Every day | ORAL | 0 refills | Status: DC

## 2022-02-15 MED ORDER — GABAPENTIN 100 MG PO CAPS: 100.0000 mg | ORAL_CAPSULE | Freq: Three times a day (TID) | ORAL | 0 refills | Status: DC

## 2022-02-15 MED ORDER — SERTRALINE HCL 25 MG PO TABS: 25.0000 mg | ORAL_TABLET | Freq: Every day | ORAL | 0 refills | Status: AC
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
## 2022-02-15 NOTE — BHH Suicide Risk Assessment (Signed)
[facility] Discharge Suicide Risk Assessment   Principal Problem: Bipolar 1 disorder, depressed (HCC) Discharge Diagnoses: Principal Problem:   Bipolar 1 disorder, depressed (HCC) Active Problems:   Stimulant use disorder   PTSD (post-traumatic stress disorder)   Borderline personality disorder (HCC)   Total Time spent with patient: 15 minutes  Patient is a 61 year old male with a past psychiatric history of stimulant use disorder, unspecified bipolar disorder, PTSD, and borderline personality disorder and a past medical history of  Hepatitis C and Sarcoidosis who was involuntarily admitted to [facility] on 02/11/22 due to homicidal ideation with intent and impulsive behavior following a 3 day binge on crack-cocaine.  Patient was in prison from 1979 - 1992 due to armed robbery, attempted kidnapping, and attempted murder.    During the patient's hospitalization, patient had extensive initial psychiatric evaluation, and follow-up psychiatric evaluations every day.  Psychiatric diagnoses provided upon initial assessment:  Bipolar 1 Disorder, does appear to meet type I by Hx Stimulant Use Disorder PTSD Borderline Personality Disorder  Patient's psychiatric medications were adjusted on admission:  -Start Seroquel 250 mg at bedtime, for mood and sleep -Discontinue Depakote  During the hospitalization, other adjustments were made to the patient's psychiatric medication regimen:  -seroquel was incraesed to 300 mg qhs -sertraline 25 mg once daily was started -gabapentin 100 mg tid was started for neuropathic pain   Patient's care was discussed during the interdisciplinary team meeting every day during the hospitalization.  The patient deined having side effects to prescribed psychiatric medication.  Gradually, patient started adjusting to milieu. The patient was evaluated each day by a clinical provider to ascertain response to treatment. Improvement was noted by the  patient's report of decreasing symptoms, improved sleep and appetite, affect, medication tolerance, behavior, and participation in unit programming.  Patient was asked each day to complete a self inventory noting mood, mental status, pain, new symptoms, anxiety and concerns.    Symptoms were reported as significantly decreased or resolved completely by discharge.   On day of discharge, the patient reports that their mood is stable. The patient denied having suicidal thoughts for more than 48 hours prior to discharge.  Patient denies having homicidal thoughts.  Patient denies having auditory hallucinations.  Patient denies any visual hallucinations or other symptoms of psychosis. The patient was motivated to continue taking medication with a goal of continued improvement in mental health.   The patient reports their target psychiatric symptoms of depression, suicidal thoughts, homicidal thoughts and substance cravings, all responded well to the psychiatric medications, and the patient reports overall benefit other psychiatric hospitalization. Supportive psychotherapy was provided to the patient. The patient also participated in regular group therapy while hospitalized. Coping skills, problem solving as well as relaxation therapies were also part of the unit programming.  Labs were reviewed with the patient, and abnormal results were discussed with the patient.  The patient is able to verbalize their individual safety plan to this provider.  # It is recommended to the patient to continue psychiatric medications as prescribed, after discharge from the hospital.    # It is recommended to the patient to follow up with your outpatient psychiatric provider and PCP.  # It was discussed with the patient, the impact of alcohol, drugs, tobacco have been there overall psychiatric and medical wellbeing, and total abstinence from substance use was recommended the patient.ed.  # Prescriptions provided or sent  directly to preferred pharmacy at discharge. Patient agreeable to plan. Given opportunity  to ask questions. Appears to feel comfortable with discharge.    # In the event of worsening symptoms, the patient is instructed to call the crisis hotline, 911 and or go to the nearest ED for appropriate evaluation and treatment of symptoms. To follow-up with primary care provider for other medical issues, concerns and or health care needs  # Patient was discharged home with a plan to follow up as noted below.    Psychiatric Specialty Exam  Presentation  General Appearance: Casual; Fairly Groomed  Eye Contact:Good  Speech:Clear and Coherent; Normal Rate  Speech Volume:Normal  Handedness:No data recorded  Mood and Affect  Mood:Euthymic  Duration of Depression Symptoms: Greater than two weeks  Affect:Full Range   Thought Process  Thought Processes:Linear  Descriptions of Associations:Intact  Orientation:Full (Time, Place and Person)  Thought Content:Logical  History of Schizophrenia/Schizoaffective disorder:No  Duration of Psychotic Symptoms:No data recorded Hallucinations:Hallucinations: None  Ideas of Reference:None  Suicidal Thoughts:Suicidal Thoughts: No  Homicidal Thoughts:Homicidal Thoughts: No   Sensorium  Memory:Immediate Good; Recent Good; Remote Good  Judgment:Fair  Insight:Fair   Executive Functions  Concentration:Fair  Attention Span:Fair  Recall:Good  Fund of Knowledge:Good  Language:Good   Psychomotor Activity  Psychomotor Activity:Psychomotor Activity: Normal (no eps on exam today. aims score zero on day of dc.)   Assets  Assets:Communication Skills; Desire for Improvement; Physical Health; Resilience; Social Support   Sleep  Sleep:Sleep: Fair   Physical Exam: Physical Exam See discharge summary  ROS See discharge summary  Blood pressure 102/73, pulse 90, temperature 97.7 F (36.5 C), temperature source Oral, resp. rate 20,  height 5\' 9"  (1.753 m), weight 64 kg, SpO2 100 %. Body mass index is 20.82 kg/m.  Mental Status Per Nursing Assessment::   On Admission:     Demographic Factors:  Male and Low socioeconomic status  Loss Factors: Financial problems/change in socioeconomic status  Historical Factors: Prior suicide attempts and Impulsivity  Risk Reduction Factors:   Responsible for children under 43 years of age, Sense of responsibility to family, Positive social support, Positive therapeutic relationship, and Positive coping skills or problem solving skills  Continued Clinical Symptoms:  Bipolar d/o- mood is stable. Denies SI, HI.   Cognitive Features That Contribute To Risk:  None    Suicide Risk:  Mild:  There are no identifiable suicide plans, no associated intent, mild dysphoria and related symptoms, good self-control (both objective and subjective assessment), few other risk factors, and identifiable protective factors, including available and accessible social support.    Follow-up Information     Services, [facility] Follow up on 02/18/2022.   Why: You have been accepted for an intake appointment on 02/18/2022 at 9:00am. Contact information: 04/20/2022 [place] [phone]         [facility] Follow up.   Specialty: Behavioral Health Why: Please go to this provider for an assessment to obtain therapy and medication management services on Monday or Wednesday, Arrive by 7:30 am.  Services are provided on a first come, first served basis. Contact information: [address] [place] [phone]        [facility] Follow up on 02/16/2022.   Why: You are scheduled to be at this facility on 02/16/2022 for an intake for admissions. Contact information: [facility] [place]: Address: [address] [place], [place]  Phone: [phone] Toll-free number: [phone]                Plan Of  Care/Follow-up  recommendations:   Activity: as tolerated  Diet: heart healthy  Other: -Follow-up with your outpatient psychiatric provider -instructions on appointment date, time, and address (location) are provided to you in discharge paperwork.  -Take your psychiatric medications as prescribed at discharge - instructions are provided to you in the discharge paperwork  -Follow-up with outpatient primary care doctor and other specialists -for management of preventative medicine and chronic medical disease, including: sarcoidosis, hepatitis C, h/o MI   -Testing: Follow-up with outpatient provider for abnormal lab results:  Abnormal lipid panel  -Recommend abstinence from alcohol, tobacco, and other illicit drug use at discharge.   -If your psychiatric symptoms recur, worsen, or if you have side effects to your psychiatric medications, call your outpatient psychiatric provider, 911, [phone] or go to the nearest emergency department.  -If suicidal thoughts recur, call your outpatient psychiatric provider, 911, [phone] or go to the nearest emergency department.    Cristy Hilts, MD 02/15/2022, 11:04 AM

## 2022-02-15 NOTE — Group Note (Signed)
LCSW Group Therapy Note   Group Date: 02/15/2022 Start Time: 1300 End Time: 1400  Type of Therapy and Topic:  Group Therapy:  triggers  Participation Level:  Did not attend   Description of Group:   Recognizing Triggers: Patients defined triggers and discussed the importance of recognizing their personal warning signs. Patients identified their own triggers and how they tend to cope with stressful situations. Patients discussed areas such as people, places, things, and thoughts that rigger certain emotions for them. CSW provided support to patients and discussed safety planning for when these triggers occur. Group participants had opportunities to share openly with the group and participate in a group discussion while providing support and feedback to their peers.  Therapeutic Goals: Patient will identify triggers that are contributing to a problem in their life Patient will identify unwanted behaviors and feelings associated with a trigger.  Patient will share with other group members strategies to confront and avoid triggers so that they may be able to react appropriately to triggers in daily life.    Summary of Patient Progress:   Did not attend    Therapeutic Modalities:   Cognitive Behavioral Therapy Solution Focused Therapy Motivational Interviewing Family Systems Approach  Aram Beecham, [place] 02/15/2022  2:04 PM

## 2022-02-15 NOTE — Progress Notes (Signed)
  [facility] Adult Case Management Discharge Plan :  Will you be returning to the same living situation after discharge:  No. [facility] in [place]  At discharge, do you have transportation home?: Yes,  Taxi  Do you have the ability to pay for your medications: Yes,  Community Support   Release of information consent forms completed and in the chart;  Patient's signature needed at discharge.  Patient to Follow up at:  Follow-up Information     [facility], [facility] Follow up on 02/18/2022.   Why: You have been accepted for an intake appointment on 02/18/2022 at 9:00am. Contact information: [place] [address] [phone]         [facility] Follow up.   Specialty: Behavioral Health Why: Please go to this provider for an assessment to obtain therapy and medication management services on Monday or Wednesday, Arrive by 7:30 am.  Services are provided on a first come, first served basis. Contact information: [address] [place] [address] [phone]        [facility] Follow up on 02/16/2022.   Why: You are scheduled to be at this facility on 02/16/2022 for an intake for admissions. Contact information: [facility] [place]: Address: [address] [place], [place] [address]  Phone: [phone] Toll-free number: [phone]                Next level of care provider has access to [facility] Link:yes  Safety Planning and Suicide Prevention discussed: Yes,  with patient      Has patient been referred to the Quitline?: Patient refused referral  Patient has been referred for addiction treatment: Yes  Aram Beecham, LCSWA 02/15/2022, 11:09 AM

## 2022-02-15 NOTE — BHH Group Notes (Signed)
[facility] Group Notes:  (Nursing/MHT/Case Management/Adjunct)  Date:  02/15/2022  Time:  10:20 AM  Type of Therapy:   Goals Group  Participation Level:  Active  Participation Quality:  Appropriate, Attentive, Redirectable, Sharing, and Supportive  Affect:  Appropriate and Excited  Cognitive:  Alert, Appropriate, and Oriented  Insight:  Good  Engagement in Group:  Engaged, Improving, and Supportive  Modes of Intervention:  Discussion, Education, Exploration, Problem-solving, Rapport Building, Socialization, and Support  Summary of Progress/Problems: Pt shared that he is grateful for medication management and support while at [facility]. Reports that he is looking forward to receiving more help with discharge plans.  Karren Burly 02/15/2022, 10:20 AM

## 2022-02-15 NOTE — Progress Notes (Signed)
RN met with pt and reviewed pt's discharge instructions. Pt verbalized understanding of discharge instructions and pt did not have any questions. RN reviewed and provided pt with a copy of SRA, AVS and Transition Record. RN returned pt's belongings to pt.  Paper Prescriptions and medicat\ion  samples were given to pt. Pt denied SI/HI/AVH and voiced no concerns. Pt was appreciative of the care pt received at [facility]. Patient discharged to the lobby without incident.

## 2022-02-15 NOTE — Group Note (Signed)
Recreation Therapy Group Note   Group Topic:Stress Management  Group Date: 02/15/2022 Start Time: 0930 End Time: 1000 Facilitators: Caroll Rancher, LRT,CTRS Location: [address] Dayroom   Goal Area(s) Addresses:  Patient will review and complete packet supporting identification of stressors and and techniques to combat compounding stress.      Clinical Observations/Individualized Feedback: LRT provided pt a workbook reviewing stress management concepts and offering an opportunity to create a plan to address stressors post d/c. Pt given the option to complete the packet in the dayroom with peers or at their own pace in their room.    Plan: Continue to engage patient in RT group sessions 2-3x/week.   Caroll Rancher, LRT,CTRS 02/15/2022 1:41 PM

## 2023-03-24 ENCOUNTER — Other Ambulatory Visit: Payer: Self-pay

## 2023-03-24 ENCOUNTER — Observation Stay
Admission: EM | Admit: 2023-03-24 | Discharge: 2023-03-25 | Disposition: A | Discharge status: Home / Self Care | Payer: Self-pay | Attending: Internal Medicine | Admitting: Internal Medicine

## 2023-03-24 DIAGNOSIS — Z79899 Other long term (current) drug therapy: Secondary | ICD-10-CM | POA: Insufficient documentation

## 2023-03-24 DIAGNOSIS — F319 Bipolar disorder, unspecified: Secondary | ICD-10-CM | POA: Diagnosis present

## 2023-03-24 DIAGNOSIS — F17210 Nicotine dependence, cigarettes, uncomplicated: Secondary | ICD-10-CM | POA: Insufficient documentation

## 2023-03-24 DIAGNOSIS — T492X2A Poisoning by local astringents and local detergents, intentional self-harm, initial encounter: Principal | ICD-10-CM | POA: Diagnosis present

## 2023-03-24 DIAGNOSIS — T490X1A Poisoning by local antifungal, anti-infective and anti-inflammatory drugs, accidental (unintentional), initial encounter: Principal | ICD-10-CM

## 2023-03-24 LAB — CBC
HCT: 47.2 % (ref 39.0–52.0)
Hemoglobin: 15.5 g/dL (ref 13.0–17.0)
MCH: 28.2 pg (ref 26.0–34.0)
MCHC: 32.8 g/dL (ref 30.0–36.0)
MCV: 86 fL (ref 80.0–100.0)
Platelets: 242 10*3/uL (ref 150–400)
RBC: 5.49 MIL/uL (ref 4.22–5.81)
RDW: 12.9 % (ref 11.5–15.5)
WBC: 4.4 10*3/uL (ref 4.0–10.5)
nRBC: 0 % (ref 0.0–0.2)

## 2023-03-24 LAB — COMPREHENSIVE METABOLIC PANEL
ALT: 26 U/L (ref 0–44)
AST: 26 U/L (ref 15–41)
Albumin: 4 g/dL (ref 3.5–5.0)
Alkaline Phosphatase: 78 U/L (ref 38–126)
Anion gap: 10 (ref 5–15)
BUN: 14 mg/dL (ref 8–23)
CO2: 29 mmol/L (ref 22–32)
Calcium: 9.4 mg/dL (ref 8.9–10.3)
Chloride: 98 mmol/L (ref 98–111)
Creatinine, Ser: 1.1 mg/dL (ref 0.61–1.24)
GFR, Estimated: >60 mL/min (ref >60)
Glucose, Bld: 175 mg/dL — ABNORMAL HIGH (ref 70–99)
Potassium: 3.7 mmol/L (ref 3.5–5.1)
Sodium: 137 mmol/L (ref 135–145)
Total Bilirubin: 0.5 mg/dL (ref 0.3–1.2)
Total Protein: 7.6 g/dL (ref 6.5–8.1)

## 2023-03-24 LAB — VALPROIC ACID LEVEL
Valproic Acid Lvl: 61 ug/mL (ref 50.0–100.0)
Valproic Acid Lvl: 44 ug/mL — ABNORMAL LOW (ref 50.0–100.0)

## 2023-03-24 LAB — ETHANOL: Alcohol, Ethyl (B): <10 mg/dL (ref <10)

## 2023-03-24 LAB — SALICYLATE LEVEL: Salicylate Lvl: <7 mg/dL — ABNORMAL LOW (ref 7.0–30.0)

## 2023-03-24 LAB — ACETAMINOPHEN LEVEL
Acetaminophen (Tylenol), Serum: <10 ug/mL — ABNORMAL LOW (ref 10–30)
Acetaminophen (Tylenol), Serum: <10 ug/mL — ABNORMAL LOW (ref 10–30)

## 2023-03-24 MED ORDER — ONDANSETRON HCL 4 MG PO TABS: 4.0000 mg | ORAL_TABLET | Freq: Four times a day (QID) | ORAL | Status: DC | PRN

## 2023-03-24 MED ORDER — GABAPENTIN 100 MG PO CAPS
100.0000 mg | ORAL_CAPSULE | Freq: Three times a day (TID) | ORAL | Status: DC
  Administered 2023-03-24 – 2023-03-25 (×2): 100 mg via ORAL
  Filled 2023-03-24 (×2): qty 1

## 2023-03-24 MED ORDER — ONDANSETRON HCL 4 MG/2ML IJ SOLN: 4.0000 mg | Freq: Four times a day (QID) | INTRAMUSCULAR | Status: DC | PRN

## 2023-03-24 MED ORDER — ACETAMINOPHEN 325 MG PO TABS: 650.0000 mg | ORAL_TABLET | Freq: Four times a day (QID) | ORAL | Status: DC | PRN

## 2023-03-24 MED ORDER — ACETAMINOPHEN 650 MG RE SUPP: 650.0000 mg | Freq: Four times a day (QID) | RECTAL | Status: DC | PRN

## 2023-03-24 MED ORDER — MAGNESIUM HYDROXIDE 400 MG/5ML PO SUSP: 30.0000 mL | Freq: Every day | ORAL | Status: DC | PRN

## 2023-03-24 MED ORDER — ENOXAPARIN SODIUM 40 MG/0.4ML IJ SOSY
40.0000 mg | PREFILLED_SYRINGE | INTRAMUSCULAR | Status: DC
  Administered 2023-03-25: 40 mg via SUBCUTANEOUS
  Filled 2023-03-24: qty 0.4

## 2023-03-24 MED ORDER — SODIUM CHLORIDE 0.9 % IV SOLN: INTRAVENOUS | Status: DC

## 2023-03-24 MED ORDER — PANTOPRAZOLE SODIUM 40 MG IV SOLR
40.0000 mg | Freq: Two times a day (BID) | INTRAVENOUS | Status: DC
  Administered 2023-03-24 – 2023-03-25 (×2): 40 mg via INTRAVENOUS
  Filled 2023-03-24 (×2): qty 10

## 2023-03-24 MED ORDER — QUETIAPINE FUMARATE 300 MG PO TABS
300.0000 mg | ORAL_TABLET | Freq: Every day | ORAL | Status: DC
  Administered 2023-03-24: 300 mg via ORAL
  Filled 2023-03-24: qty 1

## 2023-03-24 MED ORDER — TRAZODONE HCL 50 MG PO TABS: 25.0000 mg | ORAL_TABLET | Freq: Every evening | ORAL | Status: DC | PRN

## 2023-03-24 MED ORDER — SERTRALINE HCL 50 MG PO TABS: 25.0000 mg | ORAL_TABLET | Freq: Every day | ORAL | Status: DC

## 2023-03-24 NOTE — Assessment & Plan Note (Addendum)
-   The patient will be admitted to a an observation medical telemetry. - We will follow-up with uncontrolled recommendations including monitoring for: Nausea, vomiting, abdominal pain and dysphagia for 6 hours as well as consider GI consultation for endoscopy should he develop such symptoms. - Tylenol level was normal 4 hours after ingestion and EKG showed no acute findings. - Psychiatry consult will be obtained. - I notified Dr. Renato Shin about the patient.

## 2023-03-24 NOTE — ED Provider Notes (Signed)
[facility] Provider Note    Event Date/Time   First MD Initiated Contact with Patient 03/24/23 1617     (approximate)   History   Ingestion and Medical Clearance   HPI  Alexis Castaneda is a 62 year old male with history of bipolar disorder, PTSD presenting to the emergency department for evaluation after an ingestion.  Patient is currently incarcerated and suicide watch.  There, he drink 2 cups of a cleaning mixture containing hydrogen peroxide, [place].  Does report that this was a self-harm attempt, states he possibly was trying to kill himself.  Reports he initially had some neck pain and nausea, but tells me that this is resolved at the time of my evaluation.  Denies abdominal pain.     Physical Exam   Triage Vital Signs: ED Triage Vitals  Encounter Vitals Group     BP 03/24/23 1610 (!) 125/102     Systolic BP Percentile --      Diastolic BP Percentile --      Pulse Rate 03/24/23 1610 89     Resp 03/24/23 1610 20     Temp 03/24/23 1610 98.2 F (36.8 C)     Temp Source 03/24/23 1610 Oral     SpO2 03/24/23 1610 98 %     Weight --      Height --      Head Circumference --      Peak Flow --      Pain Score 03/24/23 1602 1     Pain Loc --      Pain Education --      Exclude from Growth Chart --     Most recent vital signs: Vitals:   03/24/23 1610 03/24/23 1943  BP: (!) 125/102 114/87  Pulse: 89 78  Resp: 20 18  Temp: 98.2 F (36.8 C) 98.7 F (37.1 C)  SpO2: 98% 100%     General: Awake, interactive  CV:  Regular rate, good peripheral perfusion.  Resp:  Lungs clear, unlabored respirations.  Abd:  Soft, nondistended, nontender to palpation Neuro:  Symmetric facial movement, fluid speech   ED Results / Procedures / Treatments   Labs (all labs ordered are listed, but only abnormal results are displayed) Labs Reviewed  COMPREHENSIVE METABOLIC PANEL - Abnormal; Notable for the following components:      Result Value   Glucose, Bld  175 (*)    All other components within normal limits  SALICYLATE LEVEL - Abnormal; Notable for the following components:   Salicylate Lvl <7.0 (*)    All other components within normal limits  ACETAMINOPHEN LEVEL - Abnormal; Notable for the following components:   Acetaminophen (Tylenol), Serum <10 (*)    All other components within normal limits  ACETAMINOPHEN LEVEL - Abnormal; Notable for the following components:   Acetaminophen (Tylenol), Serum <10 (*)    All other components within normal limits  VALPROIC ACID LEVEL - Abnormal; Notable for the following components:   Valproic Acid Lvl 44 (*)    All other components within normal limits  ETHANOL  CBC  VALPROIC ACID LEVEL  URINE DRUG SCREEN, QUALITATIVE ([facility] ONLY)  HIV ANTIBODY (ROUTINE TESTING W REFLEX)  BASIC METABOLIC PANEL  CBC     EKG EKG independently reviewed interpreted by myself (ER attending) demonstrates:  EKG demonstrates normal sinus rhythm at a rate of 87, PR 136, QRS 78, QTc 445, no acute ST changes  RADIOLOGY Imaging independently reviewed and interpreted by myself demonstrates:  PROCEDURES:  Critical Care performed: No  Procedures   MEDICATIONS ORDERED IN ED: Medications  QUEtiapine (SEROQUEL) tablet 300 mg (has no administration in time range)  sertraline (ZOLOFT) tablet 25 mg (has no administration in time range)  gabapentin (NEURONTIN) capsule 100 mg (has no administration in time range)  enoxaparin (LOVENOX) injection 40 mg (has no administration in time range)  0.9 %  sodium chloride infusion (has no administration in time range)  acetaminophen (TYLENOL) tablet 650 mg (has no administration in time range)    Or  acetaminophen (TYLENOL) suppository 650 mg (has no administration in time range)  traZODone (DESYREL) tablet 25 mg (has no administration in time range)  magnesium hydroxide (MILK OF MAGNESIA) suspension 30 mL (has no administration in time range)  ondansetron (ZOFRAN) tablet 4 mg  (has no administration in time range)    Or  ondansetron (ZOFRAN) injection 4 mg (has no administration in time range)  pantoprazole (PROTONIX) injection 40 mg (has no administration in time range)     IMPRESSION / MDM / ASSESSMENT AND PLAN / ED COURSE  I reviewed the triage vital signs and the nursing notes.  Differential diagnosis includes, but is not limited to, ingestion with irritation of mucosal lining, clinical history not suggestive of perforation, decompensated primary psychiatric disorder  Patient's presentation is most consistent with acute presentation with potential threat to life or bodily function.  62 year old male presenting following ingestion of cleaning solution.  Likely self-harm attempt, but patient is currently in police custody and therefore is not appropriate for involuntary commitment, can be discharged when medically cleared.  Confirmed with officers at bedside and charge RN.  Initial poison control recs below:  Keep pt NPO for at least 4hrs Watch for 6hrs for N/V, abd pain, difficulty swallowing If symtpoms occur needs GI consult for endoscopy  Check tylenol level 4hr post ingestion Baseline EKG Obtained Depakote level  Poison control was recontacted at 8 PM.  They requested repeat Tylenol and valproic level in 4 hours to ensure downtrend.  They also now are requesting that the observation timeframe be extended to 24 hours given concern for development of air emboli.  They did also request a p.o. challenge.  If he tolerated this without irritation could continue to eat and drink.  Should he develop abdominal irritation, did recommend GI consultation.  Given prolonged observation recommendation, will reach out to hospitalist team to discuss admission.  Tylenol level remains negative, Depakote level is downtrending. Reviewed with hospitalist team.  They will evaluate the patient for anticipated admission.    FINAL CLINICAL IMPRESSION(S) / ED DIAGNOSES   Final  diagnoses:  Poisoning by hydrogen peroxide     Rx / DC Orders   ED Discharge Orders     None        Note:  This document was prepared using Dragon voice recognition software and may include unintentional dictation errors.   Trinna Post, MD 03/24/23 [phone]

## 2023-03-24 NOTE — ED Notes (Signed)
Poison control called [employer] that we should add a valproic acid level to be drawn 4 hours after the previous one was checked to ensure the level is going down. Writer also informed to increase Observation timeframe to 24 hours since initial ingestion due to possibility of air emboli. Writer instructed to perform PO challenge and that if patient has not issues with it or irritation than he can eat/drink. If patient does have any trouble/throat or abdominal irritation with PO challenge then we should consult GI. Dr. Rosalia Hammers notified of entire contents of conversation.

## 2023-03-24 NOTE — ED Triage Notes (Addendum)
Pt to ED with [employer] from [facility]. Pt drank approximately 2 cups of mixture of oxy [place] cleaning compound that contained hydrogen peroxide (1-10%) approximately 1hr ago. Pt denies CP or SOB. Pt reports left sided neck pain. Pt A&Ox4.   Poison control recommendations by Angelique Blonder RN:  Keep pt NPO for at least 4hrs Watch for 6hrs for N/V, abd pain, difficulty swallowing If symtpoms occur needs GI consult for endoscopy  Check tylenol level 4hr post ingestion Baseline EKG

## 2023-03-24 NOTE — ED Notes (Signed)
Poison control requests depakote level drawn
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
## 2023-03-24 NOTE — ED Notes (Signed)
Pt passed PO trial. No throat/abdominal irritation. No difficulty swallowing

## 2023-03-24 NOTE — Assessment & Plan Note (Addendum)
-   We will continue his Depakote and Zoloft as well as Seroquel.

## 2023-03-25 DIAGNOSIS — F319 Bipolar disorder, unspecified: Secondary | ICD-10-CM

## 2023-03-25 DIAGNOSIS — T492X2A Poisoning by local astringents and local detergents, intentional self-harm, initial encounter: Principal | ICD-10-CM

## 2023-03-25 LAB — BASIC METABOLIC PANEL
Anion gap: 6 (ref 5–15)
BUN: 12 mg/dL (ref 8–23)
CO2: 29 mmol/L (ref 22–32)
Calcium: 8.5 mg/dL — ABNORMAL LOW (ref 8.9–10.3)
Chloride: 104 mmol/L (ref 98–111)
Creatinine, Ser: 1.07 mg/dL (ref 0.61–1.24)
GFR, Estimated: >60 mL/min (ref >60)
Glucose, Bld: 141 mg/dL — ABNORMAL HIGH (ref 70–99)
Potassium: 3.3 mmol/L — ABNORMAL LOW (ref 3.5–5.1)
Sodium: 139 mmol/L (ref 135–145)

## 2023-03-25 LAB — URINE DRUG SCREEN, QUALITATIVE (ARMC ONLY)
Amphetamines, Ur Screen: NOT DETECTED
Barbiturates, Ur Screen: NOT DETECTED
Benzodiazepine, Ur Scrn: NOT DETECTED
Cannabinoid 50 Ng, Ur ~~LOC~~: NOT DETECTED
Cocaine Metabolite,Ur ~~LOC~~: NOT DETECTED
MDMA (Ecstasy)Ur Screen: NOT DETECTED
Methadone Scn, Ur: NOT DETECTED
Opiate, Ur Screen: NOT DETECTED
Phencyclidine (PCP) Ur S: NOT DETECTED
Tricyclic, Ur Screen: POSITIVE — AB

## 2023-03-25 LAB — CBC
HCT: 40.4 % (ref 39.0–52.0)
Hemoglobin: 13.6 g/dL (ref 13.0–17.0)
MCH: 28.6 pg (ref 26.0–34.0)
MCHC: 33.7 g/dL (ref 30.0–36.0)
MCV: 84.9 fL (ref 80.0–100.0)
Platelets: 203 10*3/uL (ref 150–400)
RBC: 4.76 MIL/uL (ref 4.22–5.81)
RDW: 12.8 % (ref 11.5–15.5)
WBC: 4 10*3/uL (ref 4.0–10.5)
nRBC: 0 % (ref 0.0–0.2)

## 2023-03-25 LAB — HIV ANTIBODY (ROUTINE TESTING W REFLEX): HIV Screen 4th Generation wRfx: NONREACTIVE

## 2023-03-25 MED ORDER — ORAL CARE MOUTH RINSE: 15.0000 mL | OROMUCOSAL | Status: DC | PRN

## 2023-03-25 MED ORDER — PANTOPRAZOLE SODIUM 40 MG PO TBEC: 40.0000 mg | DELAYED_RELEASE_TABLET | Freq: Every day | ORAL | 0 refills | Status: AC

## 2023-03-25 MED ORDER — ONDANSETRON HCL 4 MG PO TABS: 4.0000 mg | ORAL_TABLET | Freq: Three times a day (TID) | ORAL | 0 refills | Status: AC | PRN
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
## 2023-03-25 NOTE — Progress Notes (Signed)
Poison Control, [phone] has called for an update on the patient. He has no signs or symptoms of nausea, vomit, pain. Denies. The patient has eaten a sandwich box lunch with no nausea, vomit, pain.  24 hour observation for possible air embolism. That will be completed at 1500 today.

## 2023-03-25 NOTE — Plan of Care (Signed)
